# Patient Record
Sex: Female | Born: 1965
Health system: Southern US, Community
[De-identification: ages and names within clinical notes are randomized; demographics above are authoritative.]

## PROBLEM LIST (undated history)

## (undated) DIAGNOSIS — K208 Other esophagitis without bleeding: Secondary | ICD-10-CM

## (undated) DIAGNOSIS — E785 Hyperlipidemia, unspecified: Secondary | ICD-10-CM

## (undated) DIAGNOSIS — M1990 Unspecified osteoarthritis, unspecified site: Secondary | ICD-10-CM

## (undated) DIAGNOSIS — Z9889 Other specified postprocedural states: Secondary | ICD-10-CM

## (undated) DIAGNOSIS — T364X5A Adverse effect of tetracyclines, initial encounter: Secondary | ICD-10-CM

## (undated) DIAGNOSIS — K259 Gastric ulcer, unspecified as acute or chronic, without hemorrhage or perforation: Secondary | ICD-10-CM

## (undated) DIAGNOSIS — T50904A Poisoning by unspecified drugs, medicaments and biological substances, undetermined, initial encounter: Secondary | ICD-10-CM

## (undated) DIAGNOSIS — R112 Nausea with vomiting, unspecified: Secondary | ICD-10-CM

## (undated) HISTORY — DX: Hyperlipidemia, unspecified: E78.5

## (undated) HISTORY — PX: TONSILLECTOMY: SUR1361

## (undated) HISTORY — PX: COSMETIC SURGERY: SHX468

## (undated) HISTORY — PX: BREAST SURGERY: SHX581

## (undated) HISTORY — PX: HIP SURGERY: SHX245

---

## 2003-12-26 HISTORY — PX: BREAST SURGERY: SHX581

## 2011-01-15 ENCOUNTER — Encounter: Payer: Self-pay | Admitting: Unknown Physician Specialty

## 2011-07-10 ENCOUNTER — Other Ambulatory Visit (HOSPITAL_COMMUNITY): Payer: Self-pay | Admitting: Orthopedic Surgery

## 2011-07-10 DIAGNOSIS — M545 Low back pain, unspecified: Secondary | ICD-10-CM

## 2011-07-12 ENCOUNTER — Other Ambulatory Visit (HOSPITAL_COMMUNITY): Payer: Self-pay

## 2011-07-14 ENCOUNTER — Ambulatory Visit (HOSPITAL_COMMUNITY)
Admission: RE | Admit: 2011-07-14 | Discharge: 2011-07-14 | Disposition: A | Discharge status: Home / Self Care | Payer: BC Managed Care – PPO | Source: Ambulatory Visit | Attending: Orthopedic Surgery | Admitting: Orthopedic Surgery

## 2011-07-14 DIAGNOSIS — M25559 Pain in unspecified hip: Secondary | ICD-10-CM | POA: Insufficient documentation

## 2011-07-14 DIAGNOSIS — M549 Dorsalgia, unspecified: Secondary | ICD-10-CM | POA: Insufficient documentation

## 2011-07-14 DIAGNOSIS — M545 Low back pain: Secondary | ICD-10-CM

## 2011-07-14 DIAGNOSIS — M4180 Other forms of scoliosis, site unspecified: Secondary | ICD-10-CM | POA: Insufficient documentation

## 2011-07-14 DIAGNOSIS — IMO0001 Reserved for inherently not codable concepts without codable children: Secondary | ICD-10-CM | POA: Insufficient documentation

## 2011-07-14 DIAGNOSIS — M79609 Pain in unspecified limb: Secondary | ICD-10-CM | POA: Insufficient documentation

## 2011-09-11 ENCOUNTER — Other Ambulatory Visit: Payer: Self-pay | Admitting: Unknown Physician Specialty

## 2011-09-11 DIAGNOSIS — N6320 Unspecified lump in the left breast, unspecified quadrant: Secondary | ICD-10-CM

## 2011-09-20 ENCOUNTER — Ambulatory Visit
Admission: RE | Admit: 2011-09-20 | Discharge: 2011-09-20 | Disposition: A | Discharge status: Home / Self Care | Payer: BC Managed Care – PPO | Source: Ambulatory Visit | Attending: Unknown Physician Specialty | Admitting: Unknown Physician Specialty

## 2011-09-20 DIAGNOSIS — N6320 Unspecified lump in the left breast, unspecified quadrant: Secondary | ICD-10-CM

## 2012-03-22 ENCOUNTER — Telehealth: Payer: Self-pay | Admitting: *Deleted

## 2012-03-22 NOTE — Telephone Encounter (Signed)
Patient returned my call.  Confirmed 04/01/12 genetics appt w/ pt.

## 2012-04-01 ENCOUNTER — Other Ambulatory Visit: Payer: BC Managed Care – PPO | Admitting: Lab

## 2012-04-01 ENCOUNTER — Ambulatory Visit (HOSPITAL_BASED_OUTPATIENT_CLINIC_OR_DEPARTMENT_OTHER): Payer: BC Managed Care – PPO | Admitting: Genetic Counselor

## 2012-04-01 DIAGNOSIS — Z803 Family history of malignant neoplasm of breast: Secondary | ICD-10-CM

## 2012-04-01 NOTE — Progress Notes (Signed)
Tammie Moore, a 46 y.o. female, came in for a discussion of her family history of breast cancer. She presents to clinic today to discuss the possibility of a genetic predisposition to cancer, and to further clarify her risks, as well as her family members' risks for cancer.   HISTORY OF PRESENT ILLNESS: Tammie Moore is a 46 y.o. female with no personal history of cancer.    No past medical history on file.  No past surgical history on file.  History  Substance Use Topics  . Smoking status: Not on file  . Smokeless tobacco: Not on file  . Alcohol Use: Not on file    REPRODUCTIVE HISTORY AND PERSONAL RISK ASSESSMENT FACTORS: Menarche was at age 4.   Pre-menopausal Uterus Intact: Yes Ovaries Intact: Yes G2P2A0 , first live birth at age 41  She has not previously undergone treatment for infertility.   OCP use:  Approximately 5-10 years   She has not used HRT in the past.    FAMILY HISTORY:  We obtained a detailed, 4-generation family history.  Significant diagnoses are listed below: The patient has one sister who tested negative for a BRCA mutation.  Her mother was diagnosed with breast cancer at age 65.  The patient's mother had four sisters and three brothers.  One sister had breast cancer at an unknown age, and died of uterine cancer at an unknown age; a second sister was diagnosed with breast cancer in her 88s; a third sister was diagnosed with breast cancer in her 68s and then again in her 9s, and the fourth sister is alive and well.  The brothers did not have cancer.  The patient has one cousin who was diagnosed with breast cancer in her 12s-50s, and another cousin diagnosed with melanoma. Both were daughters of women with breast cancer.  There is no other cancer history on this side of the family.  The patient's father is alive and well at age 36.  He had 12 brothers and sisters.  One sister was diagnosed with breast cancer in her 39s and ovarian cancer in her 29s.   There is no other reported cancer history on this side of the family.  Patient's maternal ancestors are of [place] descent, and paternal ancestors are of [place] descent. There is no reported Ashkenazi Jewish ancestry. There is no known consanguinity.  GENETIC COUNSELING RISK ASSESSMENT, DISCUSSION, AND SUGGESTED FOLLOW UP: We reviewed the natural history and genetic etiology of sporadic, familial and hereditary cancer syndromes.  Approximately 5-10% of breast cancer is hereditary, and 85% is a result of a BRCA1 or 2 mutation.  We reviewed the red flags of hereditary breast cancer, including the dominant inheritance pattern, and what the patient's sister's negative BRCA test means for the patient as well as her sister.  We also reviewed other tests, specifically the BreastNext panel by Karna Dupes, as a way to look further into hereditary breast cancer risks should a BRCA test come back negative.  We discussed the the best person to test is someone who has had cancer, such as the patient's mother.  By testing this individual it allows [place] to provide a more accurate risk assessment for the patient and her family members.  The patient understands and will talk with her mother about undergoing testing.    The patient's family history is suggestive of the following possible diagnosis: hereditary breast cancer  We discussed that identification of a hereditary cancer syndrome may help her care providers tailor the patients  medical management. If a mutation indicating a hereditary breast cancer is detected in this case, the [employer] recommendations would include increased cancer screening and possible prophylactic sugery. If a mutation is detected, the patient will be referred back to the referring provider and to any additional appropriate care providers to discuss the relevant options.   If a mutation is not found in the patient, this will decrease the likelihood of having breast  cancer as a result of a hereditary breast cancer syndrome.  However it does not reduce her risk for breast cancer, based on her family history.  Cancer surveillance options would be discussed for the patient according to the appropriate standard National Comprehensive Cancer Network and American Cancer Society guidelines, with consideration of their personal and family history risk factors. In this case, the patient will be referred back to their care providers for discussions of management.   In order to estimate her chance of having a BRCA1 or BRCA2 mutation, we used statistical models (Penn II) and laboratory data that take into account her personal medical history, family history and ancestry.  Because each model is different, there can be a lot of variability in the risks they give.  Therefore, these numbers must be considered a rough range and not a precise risk of having a BRCA1 Or BRCA2 mutation.  These models estimate that she has approximately a 2% chance of having a mutation (1% for BRCA1 and 1% for BRCA2), based on her paternal family history. These models estimate that she has approximately a 7% chance of having a mutation (3% for BRCA1 and 4% for BRCA2), based on her maternal family history.  Based on this assessment of her family and personal history, genetic testing is recommended.  Based on the patient's personal and family history, statistical models Dondra Spry)  and literature data were used to estimate her risk of developing breast cancer. These estimate her lifetime risk of developing breast cancer to be approximately 18.3%. This estimation does not take into account any genetic testing results.   After considering the risks, benefits, and limitations, the patient decided to talk with her mother about undergoing genetic testing.   If the patient's mother does not want to undergo testing, we will then pursue genetic testing on the patient.  The patient was seen for a total of 55 minutes,  greater than 50% of which was spent face-to-face counseling.  This plan is being carried out per Dr. Milta Deiters recommendations.  This note will also be sent to the referring provider via the electronic medical record. The patient will be supplied with a summary of this genetic counseling discussion as well as educational information on the discussed hereditary cancer syndromes following the conclusion of their visit.   Patient was discussed with Dr. Drue Second.  EDUCATIONAL INFORMATION SUPPLIED TO PATIENT AT ENCOUNTER:  Hereditary Breast and Ovarian Cancer Syndrome brochure  _______________________________________________________________________ For Office Staff:  Number of people involved in session: 3 Was an Intern/ student involved with case: not applicable

## 2012-04-02 ENCOUNTER — Telehealth: Payer: Self-pay | Admitting: Genetic Counselor

## 2012-04-02 ENCOUNTER — Other Ambulatory Visit: Payer: Self-pay | Admitting: Unknown Physician Specialty

## 2012-04-02 DIAGNOSIS — D249 Benign neoplasm of unspecified breast: Secondary | ICD-10-CM

## 2012-04-02 NOTE — Telephone Encounter (Signed)
Ms. Aufiero called and updated her FH.  Her maternal aunts were paternal half sisters to her mother.  Their mother died in her 65s reportedly from heart related complications.  We discussed that, without knowing their mother's FH, her grandfather is the common link to her mother and aunts.  If there was a FH of breast cancer on their mother's side of the family, then the chance that the patient's mother had breast cancer from a hereditary factor would be less.  However, the patient is unaware of her grandfather's FH so we are not sure that it is not coming from that side as well.  I agreed that the patient's paternal aunt with both breast and ovarian cancer should consider testing.  We discussed that there are a total of 12 children on that side of the family, with lots of nieces and nephews - none of whom had cancer.  Based on risk model's, there is a 2% for testing positive based on her paternal FH.  We discussed that we can test the patient, but the most informative person to test is someone with cancer.  The patient understands this, and wants to call her paternal aunt before pursuing testing at this time.  We agreed that if her aunt does not want to get tested, we will revisit the issue of the patient coming in for a blood draw.

## 2012-04-12 ENCOUNTER — Encounter: Payer: Self-pay | Admitting: Genetic Counselor

## 2012-04-15 ENCOUNTER — Other Ambulatory Visit: Payer: BC Managed Care – PPO

## 2012-08-21 ENCOUNTER — Other Ambulatory Visit: Payer: Self-pay | Admitting: Unknown Physician Specialty

## 2012-08-21 DIAGNOSIS — D249 Benign neoplasm of unspecified breast: Secondary | ICD-10-CM

## 2013-03-20 ENCOUNTER — Ambulatory Visit
Admission: RE | Admit: 2013-03-20 | Discharge: 2013-03-20 | Disposition: A | Discharge status: Home / Self Care | Payer: BC Managed Care – PPO | Source: Ambulatory Visit | Attending: Unknown Physician Specialty | Admitting: Unknown Physician Specialty

## 2013-03-20 DIAGNOSIS — D249 Benign neoplasm of unspecified breast: Secondary | ICD-10-CM

## 2014-04-22 ENCOUNTER — Other Ambulatory Visit: Payer: Self-pay

## 2014-04-22 DIAGNOSIS — Z1231 Encounter for screening mammogram for malignant neoplasm of breast: Secondary | ICD-10-CM

## 2014-05-05 ENCOUNTER — Ambulatory Visit: Payer: BC Managed Care – PPO

## 2014-05-12 ENCOUNTER — Ambulatory Visit
Admission: RE | Admit: 2014-05-12 | Discharge: 2014-05-12 | Disposition: A | Discharge status: Home / Self Care | Payer: BC Managed Care – PPO | Source: Ambulatory Visit

## 2014-05-12 ENCOUNTER — Encounter (INDEPENDENT_AMBULATORY_CARE_PROVIDER_SITE_OTHER): Payer: Self-pay

## 2014-05-12 DIAGNOSIS — Z1231 Encounter for screening mammogram for malignant neoplasm of breast: Secondary | ICD-10-CM

## 2015-05-26 DIAGNOSIS — K259 Gastric ulcer, unspecified as acute or chronic, without hemorrhage or perforation: Secondary | ICD-10-CM

## 2015-05-26 HISTORY — DX: Gastric ulcer, unspecified as acute or chronic, without hemorrhage or perforation: K25.9

## 2015-06-21 ENCOUNTER — Encounter: Payer: Self-pay | Admitting: Internal Medicine

## 2015-06-21 ENCOUNTER — Other Ambulatory Visit: Payer: Self-pay | Admitting: Internal Medicine

## 2015-06-21 MED ORDER — PANTOPRAZOLE SODIUM 40 MG PO TBEC: 40.0000 mg | DELAYED_RELEASE_TABLET | Freq: Two times a day (BID) | ORAL | Status: DC

## 2015-06-21 NOTE — Progress Notes (Signed)
Patient ID: Tammie Moore, female   DOB: 1966/10/21, 49 y.o.   MRN: [id number] HPI: Tammie Moore is a 49 year old female with a past medical history consistent for right hip pain on chronic NSAIDs now with epigastric abdominal pain 4 weeks. Pain is in the epigastrium associated with nausea. One episode of vomiting in the last week, nonbloody nonbilious. Pain is relieved slightly by eating and then 30 minutes to an hour after eating becomes worse again. Pain is fairly constant and gnawing. No change in bowel habit, melena or hematochezia. She is using ibuprofen 6 date 100 mg 3-4 times daily approximately 5 days per week for chronic hip pain. No early satiety. No change in appetite. No weight loss.  Meds: Ibuprofen as per history of present illness  Allergies not on file  No family history on file.  History  Substance Use Topics  . Smoking status: Not on file  . Smokeless tobacco: Not on file  . Alcohol Use: Not on file    ROS: As per history of present illness, otherwise negative  There were no vitals taken for this visit. Constitutional: Well-developed and well-nourished. No distress. HEENT: Normocephalic and atraumatic. Marland Kitchen Conjunctivae are normal.  No scleral icterus. Abdominal: Soft, epigastric tenderness without rebound or guarding, nondistended. Bowel sounds active throughout. There are no masses palpable. No hepatosplenomegaly. Extremities: no clubbing, cyanosis, or edema Psychiatric: Normal mood and affect. Behavior is normal.   ASSESSMENT/PLAN: 49 year old female with a past medical history consistent for right hip pain on chronic NSAIDs now with epigastric abdominal pain 4 weeks.   1. Epigastric pain -- NSAID-related gastroduodenitis versus ulcer disease. Pantoprazole 40 mg twice a day before meals recommended 1 month, if symptoms improve can decrease to once daily while using chronic NSAIDs. Avoid NSAIDs as much as possible. Hip surgery planned for later in July which  hopefully will relieve pain and allow her to stop NSAIDs. If no improvement, worsening of pain, or any evidence of bleeding, upper endoscopy recommended.

## 2015-06-22 ENCOUNTER — Telehealth: Payer: Self-pay

## 2015-06-22 NOTE — Telephone Encounter (Signed)
Prior authorization for pantoprazole sent through cover my meds.

## 2015-06-23 ENCOUNTER — Other Ambulatory Visit: Payer: Self-pay | Admitting: Internal Medicine

## 2015-06-23 MED ORDER — SUCRALFATE 1 G PO TABS: 1.0000 g | ORAL_TABLET | Freq: Three times a day (TID) | ORAL | Status: DC

## 2015-06-24 NOTE — Telephone Encounter (Signed)
Patient has been approved through [facility] for pantoprazole and is effective from 06/22/15-06/21/16. Reference # [id number]

## 2015-08-10 ENCOUNTER — Telehealth: Payer: Self-pay | Admitting: Internal Medicine

## 2015-08-10 MED ORDER — LIDOCAINE VISCOUS 2 % MT SOLN: 20.0000 mL | Freq: Four times a day (QID) | OROMUCOSAL | Status: DC | PRN

## 2015-08-10 MED ORDER — SUCRALFATE 1 GM/10ML PO SUSP: 1.0000 g | Freq: Three times a day (TID) | ORAL | Status: DC

## 2015-08-10 NOTE — Telephone Encounter (Signed)
Pt called to report she developed acute odynophagia and chest discomfort several hours after taking doxycycline Discomfort has persisted with swallowing liquids and solids. No dysphagia. Able to handle her secretions No nausea or vomiting or fever Likely pill-induced esophagitis Continue pantoprazole 40 mg daily Begin liquid Carafate 1 g before meals and at bedtime Topical swallowed lidocaine as needed for pain Call if not improving or if worsening

## 2015-12-26 HISTORY — PX: BREAST BIOPSY: SHX20

## 2016-01-31 MED FILL — PANTOPRAZOLE SOD DR 40 MG T: 40 | 30 days supply | Qty: 60 | Fill #3

## 2016-02-01 MED FILL — FLUCONAZOLE 150 MG TABLET: 150 | 1 days supply | Qty: 1 | Fill #0

## 2016-05-31 MED FILL — FLUCONAZOLE 150 MG TABLET: 150 | 1 days supply | Qty: 1 | Fill #0

## 2016-06-02 ENCOUNTER — Other Ambulatory Visit: Payer: Self-pay | Admitting: Unknown Physician Specialty

## 2016-06-02 DIAGNOSIS — Z1231 Encounter for screening mammogram for malignant neoplasm of breast: Secondary | ICD-10-CM

## 2016-06-15 ENCOUNTER — Ambulatory Visit
Admission: RE | Admit: 2016-06-15 | Discharge: 2016-06-15 | Disposition: A | Discharge status: Home / Self Care | Payer: BLUE CROSS/BLUE SHIELD | Source: Ambulatory Visit | Attending: Unknown Physician Specialty | Admitting: Unknown Physician Specialty

## 2016-06-15 DIAGNOSIS — Z1231 Encounter for screening mammogram for malignant neoplasm of breast: Secondary | ICD-10-CM

## 2016-10-05 ENCOUNTER — Ambulatory Visit: Payer: Self-pay | Admitting: Orthopedic Surgery

## 2016-12-05 NOTE — Patient Instructions (Addendum)
Tammie Moore  12/05/2016   Your procedure is scheduled on: 12/13/16  Report to [facility] Main  Entrance take Tammie Moore  elevators to 3rd floor to  [place] [facility] [place]  AM.  Call this number if you have problems the morning of surgery [phone]   Remember: ONLY 1 PERSON MAY GO WITH YOU TO SHORT STAY TO GET  READY MORNING OF Tammie Moore.  Do not eat food or drink liquids :After Midnight.     Take these medicines the morning of surgery with A SIP OF WATER: Pantoprazole May take Valium if needed                                You may not have any metal on your body including hair pins and              piercings  Do not wear jewelry, make-up, lotions, powders or perfumes, deodorant             Do not wear nail polish.  Do not shave  48 hours prior to surgery.              Men may shave face and neck.   Do not bring valuables to the hospital. [place].  Contacts, dentures or bridgework may not be worn into surgery.  Leave suitcase in the car. After surgery it may be brought to your room.                Please read over the following fact sheets you were given: _____________________________________________________________________             [facility] - Preparing for Surgery Before surgery, you can play an important role.  Because skin is not sterile, your skin needs to be as free of germs as possible.  You can reduce the number of germs on your skin by washing with CHG (chlorahexidine gluconate) soap before surgery.  CHG is an antiseptic cleaner which kills germs and bonds with the skin to continue killing germs even after washing. Please DO NOT use if you have an allergy to CHG or antibacterial soaps.  If your skin becomes reddened/irritated stop using the CHG and inform your nurse when you arrive at Short Stay. Do not shave (including legs and underarms) for at least 48 hours prior to the first  CHG shower.  You may shave your face/neck. Please follow these instructions carefully:  1.  Shower with CHG Soap the night before surgery and the  morning of Surgery.  2.  If you choose to wash your hair, wash your hair first as usual with your  normal  shampoo.  3.  After you shampoo, rinse your hair and body thoroughly to remove the  shampoo.                           4.  Use CHG as you would any other liquid soap.  You can apply chg directly  to the skin and wash                       Gently with a scrungie or clean washcloth.  5.  Apply the CHG Soap to your body ONLY FROM THE NECK DOWN.   Do not use on face/ open                           Wound or open sores. Avoid contact with eyes, ears mouth and genitals (private parts).                       Wash face,  Genitals (private parts) with your normal soap.             6.  Wash thoroughly, paying special attention to the area where your surgery  will be performed.  7.  Thoroughly rinse your body with warm water from the neck down.  8.  DO NOT shower/wash with your normal soap after using and rinsing off  the CHG Soap.                9.  Pat yourself dry with a clean towel.            10.  Wear clean pajamas.            11.  Place clean sheets on your bed the night of your first shower and do not  sleep with pets. Day of Surgery : Do not apply any lotions/deodorants the morning of surgery.  Please wear clean clothes to the hospital/surgery center.  FAILURE TO FOLLOW THESE INSTRUCTIONS MAY RESULT IN THE CANCELLATION OF YOUR SURGERY PATIENT SIGNATURE_________________________________  NURSE SIGNATURE__________________________________  ________________________________________________________________________   Adam Phenix  An incentive spirometer is a tool that can help keep your lungs clear and active. This tool measures how well you are filling your lungs with each breath. Taking long deep breaths may help reverse or decrease the  chance of developing breathing (pulmonary) problems (especially infection) following:  A long period of time when you are unable to move or be active. BEFORE THE PROCEDURE   If the spirometer includes an indicator to show your best effort, your nurse or respiratory therapist will set it to a desired goal.  If possible, sit up straight or lean slightly forward. Try not to slouch.  Hold the incentive spirometer in an upright position. INSTRUCTIONS FOR USE  1. Sit on the edge of your bed if possible, or sit up as far as you can in bed or on a chair. 2. Hold the incentive spirometer in an upright position. 3. Breathe out normally. 4. Place the mouthpiece in your mouth and seal your lips tightly around it. 5. Breathe in slowly and as deeply as possible, raising the piston or the ball toward the top of the column. 6. Hold your breath for 3-5 seconds or for as long as possible. Allow the piston or ball to fall to the bottom of the column. 7. Remove the mouthpiece from your mouth and breathe out normally. 8. Rest for a few seconds and repeat Steps 1 through 7 at least 10 times every 1-2 hours when you are awake. Take your time and take a few normal breaths between deep breaths. 9. The spirometer may include an indicator to show your best effort. Use the indicator as a goal to work toward during each repetition. 10. After each set of 10 deep breaths, practice coughing to be sure your lungs are clear. If you have an incision (the cut made at the time of surgery), support your incision when coughing by placing a pillow or  rolled up towels firmly against it. Once you are able to get out of bed, walk around indoors and cough well. You may stop using the incentive spirometer when instructed by your caregiver.  RISKS AND COMPLICATIONS  Take your time so you do not get dizzy or light-headed.  If you are in pain, you may need to take or ask for pain medication before doing incentive spirometry. It is harder  to take a deep breath if you are having pain. AFTER USE  Rest and breathe slowly and easily.  It can be helpful to keep track of a log of your progress. Your caregiver can provide you with a simple table to help with this. If you are using the spirometer at home, follow these instructions: [place] IF:   You are having difficultly using the spirometer.  You have trouble using the spirometer as often as instructed.  Your pain medication is not giving enough relief while using the spirometer.  You develop fever of 100.5 F (38.1 C) or higher. SEEK IMMEDIATE MEDICAL CARE IF:   You cough up bloody sputum that had not been present before.  You develop fever of 102 F (38.9 C) or greater.  You develop worsening pain at or near the incision site. MAKE SURE YOU:   Understand these instructions.  Will watch your condition.  Will get help right away if you are not doing well or get worse. Document Released: 04/23/2007 Document Revised: 03/04/2012 Document Reviewed: 06/24/2007 ExitCare Patient Information 2014 Exit[employer], [place].   ________________________________________________________________________  WHAT IS A BLOOD TRANSFUSION? Blood Transfusion Information  A transfusion is the replacement of blood or some of its parts. Blood is made up of multiple cells which provide different functions.  Red blood cells carry oxygen and are used for blood loss replacement.  White blood cells fight against infection.  Platelets control bleeding.  Plasma helps clot blood.  Other blood products are available for specialized needs, such as hemophilia or other clotting disorders. BEFORE THE TRANSFUSION  Who gives blood for transfusions?   Healthy volunteers who are fully evaluated to make sure their blood is safe. This is blood bank blood. Transfusion therapy is the safest it has ever been in the practice of medicine. Before blood is taken from a donor, a complete history is taken  to make sure that person has no history of diseases nor engages in risky social behavior (examples are intravenous drug use or sexual activity with multiple partners). The donor's travel history is screened to minimize risk of transmitting infections, such as malaria. The donated blood is tested for signs of infectious diseases, such as HIV and hepatitis. The blood is then tested to be sure it is compatible with you in order to minimize the chance of a transfusion reaction. If you or a relative donates blood, this is often done in anticipation of surgery and is not appropriate for emergency situations. It takes many days to process the donated blood. RISKS AND COMPLICATIONS Although transfusion therapy is very safe and saves many lives, the main dangers of transfusion include:   Getting an infectious disease.  Developing a transfusion reaction. This is an allergic reaction to something in the blood you were given. Every precaution is taken to prevent this. The decision to have a blood transfusion has been considered carefully by your caregiver before blood is given. Blood is not given unless the benefits outweigh the risks. AFTER THE TRANSFUSION  Right after receiving a blood transfusion, you will usually  feel much better and more energetic. This is especially true if your red blood cells have gotten low (anemic). The transfusion raises the level of the red blood cells which carry oxygen, and this usually causes an energy increase.  The nurse administering the transfusion will monitor you carefully for complications. HOME CARE INSTRUCTIONS  No special instructions are needed after a transfusion. You may find your energy is better. Speak with your caregiver about any limitations on activity for underlying diseases you may have. SEEK MEDICAL CARE IF:   Your condition is not improving after your transfusion.  You develop redness or irritation at the intravenous (IV) site. SEEK IMMEDIATE MEDICAL CARE  IF:  Any of the following symptoms occur over the next 12 hours:  Shaking chills.  You have a temperature by mouth above 102 F (38.9 C), not controlled by medicine.  Chest, back, or muscle pain.  People around you feel you are not acting correctly or are confused.  Shortness of breath or difficulty breathing.  Dizziness and fainting.  You get a rash or develop hives.  You have a decrease in urine output.  Your urine turns a dark color or changes to pink, red, or brown. Any of the following symptoms occur over the next 10 days:  You have a temperature by mouth above 102 F (38.9 C), not controlled by medicine.  Shortness of breath.  Weakness after normal activity.  The white part of the eye turns yellow (jaundice).  You have a decrease in the amount of urine or are urinating less often.  Your urine turns a dark color or changes to pink, red, or brown. Document Released: 12/08/2000 Document Revised: 03/04/2012 Document Reviewed: 07/27/2008 [facility] Patient Information 2014 [place], [place].  _______________________________________________________________________

## 2016-12-06 ENCOUNTER — Encounter (HOSPITAL_COMMUNITY): Payer: Self-pay

## 2016-12-06 ENCOUNTER — Encounter (INDEPENDENT_AMBULATORY_CARE_PROVIDER_SITE_OTHER): Payer: Self-pay

## 2016-12-06 ENCOUNTER — Encounter (HOSPITAL_COMMUNITY)
Admission: RE | Admit: 2016-12-06 | Discharge: 2016-12-06 | Disposition: A | Discharge status: Home / Self Care | Payer: BLUE CROSS/BLUE SHIELD | Source: Ambulatory Visit | Attending: Orthopedic Surgery | Admitting: Orthopedic Surgery

## 2016-12-06 DIAGNOSIS — Z01812 Encounter for preprocedural laboratory examination: Secondary | ICD-10-CM | POA: Diagnosis not present

## 2016-12-06 DIAGNOSIS — M1611 Unilateral primary osteoarthritis, right hip: Secondary | ICD-10-CM | POA: Insufficient documentation

## 2016-12-06 HISTORY — DX: Unspecified osteoarthritis, unspecified site: M19.90

## 2016-12-06 HISTORY — DX: Other esophagitis: K20.8

## 2016-12-06 HISTORY — DX: Other specified postprocedural states: R11.2

## 2016-12-06 HISTORY — DX: Poisoning by unspecified drugs, medicaments and biological substances, undetermined, initial encounter: T50.904A

## 2016-12-06 HISTORY — DX: Adverse effect of tetracyclines, initial encounter: T36.4X5A

## 2016-12-06 HISTORY — DX: Other specified postprocedural states: Z98.890

## 2016-12-06 HISTORY — DX: Gastric ulcer, unspecified as acute or chronic, without hemorrhage or perforation: K25.9

## 2016-12-06 HISTORY — DX: Nausea with vomiting, unspecified: Z98.890

## 2016-12-06 HISTORY — DX: Other esophagitis without bleeding: K20.80

## 2016-12-06 LAB — PROTIME-INR
INR: 0.99
PROTHROMBIN TIME: 13.1 s (ref 11.4–15.2)

## 2016-12-06 LAB — CBC
HEMATOCRIT: 42.8 % (ref 36.0–46.0)
HEMOGLOBIN: 14.6 g/dL (ref 12.0–15.0)
MCH: 29.4 pg (ref 26.0–34.0)
MCHC: 34.1 g/dL (ref 30.0–36.0)
MCV: 86.3 fL (ref 78.0–100.0)
PLATELETS: 313 10*3/uL (ref 150–400)
RBC: 4.96 MIL/uL (ref 3.87–5.11)
RDW: 12.9 % (ref 11.5–15.5)
WBC: 9.1 10*3/uL (ref 4.0–10.5)

## 2016-12-06 LAB — URINALYSIS, ROUTINE W REFLEX MICROSCOPIC
Bilirubin Urine: NEGATIVE
Glucose, UA: NEGATIVE mg/dL
Hgb urine dipstick: NEGATIVE
Ketones, ur: NEGATIVE mg/dL
LEUKOCYTES UA: NEGATIVE
NITRITE: NEGATIVE
PH: 6 (ref 5.0–8.0)
PROTEIN: NEGATIVE mg/dL
Specific Gravity, Urine: 1.003 — ABNORMAL LOW (ref 1.005–1.030)

## 2016-12-06 LAB — SURGICAL PCR SCREEN
MRSA, PCR: NEGATIVE
STAPHYLOCOCCUS AUREUS: NEGATIVE

## 2016-12-06 LAB — APTT: aPTT: 32 seconds (ref 24–36)

## 2016-12-06 LAB — COMPREHENSIVE METABOLIC PANEL
ALK PHOS: 44 U/L (ref 38–126)
ALT: 23 U/L (ref 14–54)
AST: 26 U/L (ref 15–41)
Albumin: 4.4 g/dL (ref 3.5–5.0)
Anion gap: 7 (ref 5–15)
BILIRUBIN TOTAL: 0.8 mg/dL (ref 0.3–1.2)
BUN: 12 mg/dL (ref 6–20)
CALCIUM: 10.2 mg/dL (ref 8.9–10.3)
CHLORIDE: 104 mmol/L (ref 101–111)
CO2: 25 mmol/L (ref 22–32)
CREATININE: 0.9 mg/dL (ref 0.44–1.00)
GFR calc Af Amer: >60 mL/min (ref >60)
Glucose, Bld: 86 mg/dL (ref 65–99)
Potassium: 4.3 mmol/L (ref 3.5–5.1)
Sodium: 136 mmol/L (ref 135–145)
Total Protein: 7.7 g/dL (ref 6.5–8.1)

## 2016-12-06 LAB — ABO/RH: ABO/RH(D): O POS

## 2016-12-06 LAB — HCG, SERUM, QUALITATIVE: PREG SERUM: NEGATIVE

## 2016-12-12 ENCOUNTER — Ambulatory Visit: Payer: Self-pay | Admitting: Orthopedic Surgery

## 2016-12-12 NOTE — H&P (Signed)
Tammie Moore DOB: November 02, 1966 Married / Language: English / Race: White Female Date of Admission:  12/13/2016 CC:  Right Hip Pain History of Present Illness The patient is a 50 year old female who comes in for a preoperative History and Physical. The patient is scheduled for a right total hip arthroplasty (anterior) to be performed by Dr. Dione Plover. Aluisio, MD at [facility] on 12-13-2016. The patient is a 50 year old female who presented with a hip problem. The patient reports right hip problems including pain, catching and popping symptoms that have been present for many month(s). Symptoms reported include hip pain The patient reports symptoms radiating to the: right groin (around to right buttock. radiates down to right ankle). The patient describes the hip problem as sharp, dull, aching, throbbing, grinding, stabbing, shooting and burning.The symptoms are described as severe.The patient feels as if their symptoms are does feel they are worsening. Symptoms are exacerbated by movement, flexing hip, squatting, weight bearing, running, walking, sitting and lying on the affected side. Current treatment includes nonsteroidal anti-inflammatory drugs (Motrin and Valium). Previous workup for this problem has included hip MRI. First diagnosed with a right hip labral tear; referred to [facility]. 2015 labral tear sx Dr. Aretha Parrot. 12 weeks post op pt. re-tore labarum. July 2016 second labral tendon graft sx. Dec. 2016 started having pain again. MRI Jan. 2017. She had an intra-articular cortisone injection in the right hip in May with no relief. She has trouble sleeping at night because of the pain. The pain is occurring in her hips at all times. She has had two arthroscopies and labral repairs with the second one being a labral autograft grafting of tendon and placing it in the area of the labrum. She states that she has pain at all times in that right hip. It is definitely limiting what she can and cannot  do. Unfortunately, this is all getting progressively worse with time. After her first scope with Dr. Aretha Parrot, she states that she was given the option of having the repeat scope and labral repair versus a hip replacement. She opted for the second arthroscopy and unfortunately has not had significant improvement. She did have an intra-articular hip injection in May and she said for a few weeks she had considerable improvements and was able to do a lot more, but then it wore off real quickly. She did not have full resolution of her pain, but it got better to the point where she was able to do more and she was satisfied with how she felt during that time span. She is currently not having any left hip pain. Unfortunately, she does have a significant problem with the hip. I did review her scope, pictures and she had a considerable amount of acetabular degeneration in that second scope. It appears that the arthroscopic pictures showed a more advanced situation than what the plain films show. Unfortunately, she is not getting better at all. The injection did help some which suggest that the process is intra-articular in nature. At this point, she really feels like she needs to do something. I would not recommend another arthroscopy. I would recommend that she pursue total hip arthroplasty because she is definitely heading quickly in that direction. We did discuss that in detail today procedure risks, potential complications, rehab course and she would like to go ahead and proceed. They have been treated conservatively in the past for the above stated problem and despite conservative measures, they continue to have progressive pain  and severe functional limitations and dysfunction. They have failed non-operative management including home exercise, medications, and injections. It is felt that they would benefit from undergoing total joint replacement. Risks and benefits of the procedure have been discussed with the patient  and they elect to proceed with surgery. There are no active contraindications to surgery such as ongoing infection or rapidly progressive neurological disease.   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  Family History  Cancer  Mother. Congestive Heart Failure  Mother. Diabetes Mellitus  Mother. First Degree Relatives  reported Heart Disease  Father. Heart disease in female family member before age 61   Social History Children  4 Current drinker  11/25/2013: Currently drinks beer and wine only occasionally per week Current work status  working part time Exercise  Exercises weekly; does running / walking Living situation  live with spouse Marital status  married No history of drug/alcohol rehab  Not under pain contract  Number of flights of stairs before winded  greater than 5 Tobacco use  Never smoker. 11/25/2013 Advance Directives  Living Will, Healthcare POA  Medication History Motrin IB (Oral) Specific strength unknown - Active. Mirena (20MCG/24HR IUD, Intrauterine) Active. Valium (10MG  Tablet, Oral) Active. Protonix (40MG  Tablet DR, Oral as needed) Active.   Past Surgical History Breast Reconstruction  bilateral Tonsillectomy    Review of Systems General Not Present- Chills, Fatigue, Fever, Memory Loss, Night Sweats, Weight Gain and Weight Loss. Skin Not Present- Eczema, Hives, Itching, Lesions and Rash. HEENT Not Present- Dentures, Double Vision, Headache, Hearing Loss, Tinnitus and Visual Loss. Respiratory Not Present- Allergies, Chronic Cough, Coughing up blood, Shortness of breath at rest and Shortness of breath with exertion. Cardiovascular Not Present- Chest Pain, Difficulty Breathing Lying Down, Murmur, Palpitations, Racing/skipping  heartbeats and Swelling. Gastrointestinal Not Present- Abdominal Pain, Bloody Stool, Constipation, Diarrhea, Difficulty Swallowing, Heartburn, Jaundice, Loss of appetitie, Nausea and Vomiting. Female Genitourinary Not Present- Blood in Urine, Discharge, Flank Pain, Incontinence, Painful Urination, Urgency, Urinary frequency, Urinary Retention, Urinating at Night and Weak urinary stream. Musculoskeletal Present- Back Pain, Joint Pain, Morning Stiffness, Muscle Pain and Spasms. Not Present- Joint Swelling and Muscle Weakness. Neurological Not Present- Blackout spells, Difficulty with balance, Dizziness, Paralysis, Tremor and Weakness. Psychiatric Not Present- Insomnia.  Vitals Weight: 148 lb Height: 65in Body Surface Area: 1.74 m Body Mass Index: 24.63 kg/m  Pulse: 64 (Regular)  BP: 118/58 (Sitting, Right Arm, Standard)   Physical Exam  General Mental Status -Alert, cooperative and good historian. General Appearance-pleasant, Not in acute distress. Orientation-Oriented X3. Build & Nutrition-Well nourished and Well developed.  Head and Neck Head-normocephalic, atraumatic . Neck Global Assessment - supple, no bruit auscultated on the right, no bruit auscultated on the left.  Eye Vision-Wears contact lenses. Pupil - Bilateral-Regular and Round. Motion - Bilateral-EOMI.  Chest and Lung Exam Auscultation Breath sounds - clear at anterior chest wall and clear at posterior chest wall. Adventitious sounds - No Adventitious sounds.  Cardiovascular Auscultation Rhythm - Regular rate and rhythm. Heart Sounds - S1 WNL and S2 WNL. Murmurs & Other Heart Sounds - Auscultation of the heart reveals - No Murmurs.  Abdomen Palpation/Percussion Tenderness - Abdomen is non-tender to palpation. Rigidity (guarding) - Abdomen is soft. Auscultation Auscultation of the abdomen reveals - Bowel sounds normal.  Female Genitourinary Note: Not done, not pertinent  to present  illness   Musculoskeletal Note: Very pleasant, well-developed female, alert and oriented, in no apparent distress. Evaluation of her left hip shows normal range of motion without any discomfort. Right hip can be flexed to about 130, rotated in 30, out 40, abducted 40 with pain on flexion and rotation of the hip both internal and external rotation. She does not have any trochanteric tenderness. She does have an antalgic gait pattern on the right. She had a paper copy of an x-ray taken in January of this year did show some superior joint space narrowing and significant sclerosis of the superior acetabulum. X-rays taken today shows about the same appearance of the joint with some narrowing superiorly and with the sclerosis in the sourcil area of the acetabulum.  Assessment & Plan Primary osteoarthritis of right hip (M16.11)  Note:Surgical Plans: Right Total Hip Replacement - Anterior Approach  Disposition: Home  PCP: Carley Hammed, FNP  IV TXA  Anesthesia Issues: Nausea and vomiting following hip scope.  Signed electronically by Ok Edwards, III PA-C

## 2016-12-12 NOTE — Anesthesia Preprocedure Evaluation (Signed)
Anesthesia Evaluation  Patient identified by MRN, date of birth, ID band Patient awake    Reviewed: Allergy & Precautions, NPO status , Patient's Chart, lab work & pertinent test results  History of Anesthesia Complications (+) PONV and history of anesthetic complications  Airway Mallampati: II  TM Distance: >3 FB Neck ROM: Full    Dental no notable dental hx.    Pulmonary neg pulmonary ROS,    Pulmonary exam normal breath sounds clear to auscultation       Cardiovascular negative cardio ROS Normal cardiovascular exam Rhythm:Regular Rate:Normal     Neuro/Psych negative neurological ROS  negative psych ROS   GI/Hepatic Neg liver ROS, PUD,   Endo/Other  negative endocrine ROS  Renal/GU negative Renal ROS     Musculoskeletal  (+) Arthritis ,   Abdominal   Peds  Hematology negative hematology ROS (+)   Anesthesia Other Findings   Reproductive/Obstetrics negative OB ROS                             Anesthesia Physical Anesthesia Plan  ASA: I  Anesthesia Plan: Spinal   Post-op Pain Management:    Induction: Intravenous  Airway Management Planned:   Additional Equipment:   Intra-op Plan:   Post-operative Plan:   Informed Consent: I have reviewed the patients History and Physical, chart, labs and discussed the procedure including the risks, benefits and alternatives for the proposed anesthesia with the patient or authorized representative who has indicated his/her understanding and acceptance.   Dental advisory given  Plan Discussed with: CRNA  Anesthesia Plan Comments:         Anesthesia Quick Evaluation

## 2016-12-13 ENCOUNTER — Inpatient Hospital Stay (HOSPITAL_COMMUNITY): Payer: BLUE CROSS/BLUE SHIELD

## 2016-12-13 ENCOUNTER — Inpatient Hospital Stay (HOSPITAL_COMMUNITY)
Admission: RE | Admit: 2016-12-13 | Discharge: 2016-12-15 | DRG: 470 | Disposition: A | Discharge status: Home Health | Payer: BLUE CROSS/BLUE SHIELD | Source: Ambulatory Visit | Attending: Orthopedic Surgery | Admitting: Orthopedic Surgery

## 2016-12-13 ENCOUNTER — Inpatient Hospital Stay (HOSPITAL_COMMUNITY): Payer: BLUE CROSS/BLUE SHIELD | Admitting: Anesthesiology

## 2016-12-13 ENCOUNTER — Encounter (HOSPITAL_COMMUNITY): Payer: Self-pay | Admitting: Registered Nurse

## 2016-12-13 ENCOUNTER — Encounter (HOSPITAL_COMMUNITY)
Admission: RE | Disposition: A | Discharge status: Home Health | Payer: Self-pay | Source: Ambulatory Visit | Attending: Orthopedic Surgery

## 2016-12-13 DIAGNOSIS — Z8711 Personal history of peptic ulcer disease: Secondary | ICD-10-CM | POA: Diagnosis not present

## 2016-12-13 DIAGNOSIS — M1611 Unilateral primary osteoarthritis, right hip: Principal | ICD-10-CM | POA: Diagnosis present

## 2016-12-13 DIAGNOSIS — Z96649 Presence of unspecified artificial hip joint: Secondary | ICD-10-CM

## 2016-12-13 DIAGNOSIS — M169 Osteoarthritis of hip, unspecified: Secondary | ICD-10-CM | POA: Diagnosis present

## 2016-12-13 HISTORY — PX: TOTAL HIP ARTHROPLASTY: SHX124

## 2016-12-13 LAB — TYPE AND SCREEN
ABO/RH(D): O POS
Antibody Screen: NEGATIVE

## 2016-12-13 SURGERY — ARTHROPLASTY, HIP, TOTAL, ANTERIOR APPROACH
Anesthesia: Spinal | Site: Hip | Laterality: Right

## 2016-12-13 MED ORDER — DEXAMETHASONE SODIUM PHOSPHATE 10 MG/ML IJ SOLN
10.0000 mg | Freq: Once | INTRAMUSCULAR | Status: AC
  Administered 2016-12-13: 10 mg via INTRAVENOUS

## 2016-12-13 MED ORDER — OXYCODONE HCL 5 MG PO TABS
5.0000 mg | ORAL_TABLET | ORAL | Status: DC | PRN
  Administered 2016-12-13 (×2): 10 mg via ORAL
  Administered 2016-12-13 (×2): 5 mg via ORAL
  Administered 2016-12-14 (×3): 10 mg via ORAL
  Filled 2016-12-13 (×2): qty 2
  Filled 2016-12-13 (×2): qty 1
  Filled 2016-12-13 (×3): qty 2

## 2016-12-13 MED ORDER — LACTATED RINGERS IV SOLN
INTRAVENOUS | Status: DC
  Administered 2016-12-13 (×3): via INTRAVENOUS

## 2016-12-13 MED ORDER — MORPHINE SULFATE (PF) 2 MG/ML IV SOLN
1.0000 mg | INTRAVENOUS | Status: DC | PRN
  Administered 2016-12-13: 17:00:00 1 mg via INTRAVENOUS
  Filled 2016-12-13: qty 1

## 2016-12-13 MED ORDER — SODIUM CHLORIDE 0.9 % IV SOLN
1000.0000 mg | Freq: Once | INTRAVENOUS | Status: AC
  Administered 2016-12-13: 13:00:00 1000 mg via INTRAVENOUS
  Filled 2016-12-13: qty 10

## 2016-12-13 MED ORDER — HYDROMORPHONE HCL 1 MG/ML IJ SOLN
INTRAMUSCULAR | Status: AC
  Filled 2016-12-13: qty 1

## 2016-12-13 MED ORDER — PROPOFOL 10 MG/ML IV BOLUS
INTRAVENOUS | Status: AC
  Filled 2016-12-13: qty 40

## 2016-12-13 MED ORDER — METOCLOPRAMIDE HCL 5 MG/ML IJ SOLN
5.0000 mg | Freq: Three times a day (TID) | INTRAMUSCULAR | Status: DC | PRN
  Administered 2016-12-14: 13:00:00 10 mg via INTRAVENOUS
  Filled 2016-12-13: qty 2

## 2016-12-13 MED ORDER — RIVAROXABAN 10 MG PO TABS
10.0000 mg | ORAL_TABLET | Freq: Every day | ORAL | Status: DC
  Administered 2016-12-14 – 2016-12-15 (×2): 10 mg via ORAL
  Filled 2016-12-13 (×2): qty 1

## 2016-12-13 MED ORDER — PHENOL 1.4 % MT LIQD: 1.0000 | OROMUCOSAL | Status: DC | PRN

## 2016-12-13 MED ORDER — METHOCARBAMOL 500 MG PO TABS
500.0000 mg | ORAL_TABLET | Freq: Four times a day (QID) | ORAL | Status: DC | PRN
  Administered 2016-12-14 – 2016-12-15 (×2): 500 mg via ORAL
  Filled 2016-12-13 (×2): qty 1

## 2016-12-13 MED ORDER — FLEET ENEMA 7-19 GM/118ML RE ENEM: 1.0000 | ENEMA | Freq: Once | RECTAL | Status: DC | PRN

## 2016-12-13 MED ORDER — PROPOFOL 500 MG/50ML IV EMUL
INTRAVENOUS | Status: DC | PRN
  Administered 2016-12-13: 100 ug/kg/min via INTRAVENOUS

## 2016-12-13 MED ORDER — BUPIVACAINE HCL (PF) 0.25 % IJ SOLN
INTRAMUSCULAR | Status: DC | PRN
Start: 2016-12-13 — End: 2016-12-13
  Administered 2016-12-13: 30 mL

## 2016-12-13 MED ORDER — ONDANSETRON HCL 4 MG PO TABS: 4.0000 mg | ORAL_TABLET | Freq: Four times a day (QID) | ORAL | Status: DC | PRN

## 2016-12-13 MED ORDER — CEFAZOLIN SODIUM-DEXTROSE 2-4 GM/100ML-% IV SOLN
2.0000 g | INTRAVENOUS | Status: AC
  Administered 2016-12-13: 2 g via INTRAVENOUS
  Filled 2016-12-13: qty 100

## 2016-12-13 MED ORDER — ONDANSETRON HCL 4 MG/2ML IJ SOLN
4.0000 mg | Freq: Four times a day (QID) | INTRAMUSCULAR | Status: DC | PRN
  Administered 2016-12-13 – 2016-12-14 (×2): 4 mg via INTRAVENOUS
  Filled 2016-12-13 (×2): qty 2

## 2016-12-13 MED ORDER — ONDANSETRON HCL 4 MG/2ML IJ SOLN
INTRAMUSCULAR | Status: AC
  Filled 2016-12-13: qty 2

## 2016-12-13 MED ORDER — KETOROLAC TROMETHAMINE 15 MG/ML IJ SOLN
15.0000 mg | Freq: Once | INTRAMUSCULAR | Status: AC
  Administered 2016-12-13: 18:00:00 15 mg via INTRAVENOUS
  Filled 2016-12-13: qty 1

## 2016-12-13 MED ORDER — FENTANYL CITRATE (PF) 100 MCG/2ML IJ SOLN
INTRAMUSCULAR | Status: DC | PRN
  Administered 2016-12-13: 100 ug via INTRAVENOUS

## 2016-12-13 MED ORDER — MIDAZOLAM HCL 2 MG/2ML IJ SOLN
INTRAMUSCULAR | Status: AC
  Filled 2016-12-13: qty 2

## 2016-12-13 MED ORDER — DEXAMETHASONE SODIUM PHOSPHATE 10 MG/ML IJ SOLN
INTRAMUSCULAR | Status: AC
  Filled 2016-12-13: qty 1

## 2016-12-13 MED ORDER — DIAZEPAM 2 MG PO TABS
2.0000 mg | ORAL_TABLET | Freq: Four times a day (QID) | ORAL | Status: DC | PRN
  Administered 2016-12-13 – 2016-12-14 (×2): 2 mg via ORAL
  Filled 2016-12-13 (×2): qty 1

## 2016-12-13 MED ORDER — HYDROMORPHONE HCL 1 MG/ML IJ SOLN
0.2500 mg | INTRAMUSCULAR | Status: DC | PRN
  Administered 2016-12-13: 0.5 mg via INTRAVENOUS

## 2016-12-13 MED ORDER — TRAMADOL HCL 50 MG PO TABS
50.0000 mg | ORAL_TABLET | Freq: Four times a day (QID) | ORAL | Status: DC | PRN
  Administered 2016-12-15 (×3): 100 mg via ORAL
  Filled 2016-12-13 (×3): qty 2

## 2016-12-13 MED ORDER — MORPHINE SULFATE (PF) 10 MG/ML IV SOLN: 1.0000 mg | INTRAVENOUS | Status: DC | PRN

## 2016-12-13 MED ORDER — PROPOFOL 10 MG/ML IV BOLUS
INTRAVENOUS | Status: DC | PRN
  Administered 2016-12-13: 20 mg via INTRAVENOUS
  Administered 2016-12-13: 30 mg via INTRAVENOUS

## 2016-12-13 MED ORDER — ACETAMINOPHEN 650 MG RE SUPP: 650.0000 mg | Freq: Four times a day (QID) | RECTAL | Status: DC | PRN

## 2016-12-13 MED ORDER — BUPIVACAINE IN DEXTROSE 0.75-8.25 % IT SOLN
INTRATHECAL | Status: DC | PRN
  Administered 2016-12-13: 2 mL via INTRATHECAL

## 2016-12-13 MED ORDER — ACETAMINOPHEN 500 MG PO TABS
1000.0000 mg | ORAL_TABLET | Freq: Four times a day (QID) | ORAL | Status: AC
  Administered 2016-12-13 – 2016-12-14 (×4): 1000 mg via ORAL
  Filled 2016-12-13 (×4): qty 2

## 2016-12-13 MED ORDER — MIDAZOLAM HCL 5 MG/5ML IJ SOLN
INTRAMUSCULAR | Status: DC | PRN
  Administered 2016-12-13 (×2): 2 mg via INTRAVENOUS

## 2016-12-13 MED ORDER — PROMETHAZINE HCL 25 MG/ML IJ SOLN: 6.2500 mg | INTRAMUSCULAR | Status: DC | PRN

## 2016-12-13 MED ORDER — METHOCARBAMOL 1000 MG/10ML IJ SOLN
500.0000 mg | Freq: Four times a day (QID) | INTRAVENOUS | Status: DC | PRN
  Administered 2016-12-13: 500 mg via INTRAVENOUS
  Filled 2016-12-13: qty 5
  Filled 2016-12-13: qty 550

## 2016-12-13 MED ORDER — FENTANYL CITRATE (PF) 100 MCG/2ML IJ SOLN
INTRAMUSCULAR | Status: AC
  Filled 2016-12-13: qty 2

## 2016-12-13 MED ORDER — ACETAMINOPHEN 325 MG PO TABS: 650.0000 mg | ORAL_TABLET | Freq: Four times a day (QID) | ORAL | Status: DC | PRN

## 2016-12-13 MED ORDER — DEXAMETHASONE SODIUM PHOSPHATE 10 MG/ML IJ SOLN
10.0000 mg | Freq: Once | INTRAMUSCULAR | Status: AC
  Administered 2016-12-14: 11:00:00 10 mg via INTRAVENOUS
  Filled 2016-12-13: qty 1

## 2016-12-13 MED ORDER — MEPERIDINE HCL 50 MG/ML IJ SOLN: 6.2500 mg | INTRAMUSCULAR | Status: DC | PRN

## 2016-12-13 MED ORDER — POLYETHYLENE GLYCOL 3350 17 G PO PACK: 17.0000 g | PACK | Freq: Every day | ORAL | Status: DC | PRN

## 2016-12-13 MED ORDER — BISACODYL 10 MG RE SUPP: 10.0000 mg | Freq: Every day | RECTAL | Status: DC | PRN

## 2016-12-13 MED ORDER — PHENYLEPHRINE HCL 10 MG/ML IJ SOLN
INTRAMUSCULAR | Status: DC | PRN
  Administered 2016-12-13: 35 ug/min via INTRAVENOUS

## 2016-12-13 MED ORDER — DOCUSATE SODIUM 100 MG PO CAPS
100.0000 mg | ORAL_CAPSULE | Freq: Two times a day (BID) | ORAL | Status: DC
  Administered 2016-12-13 – 2016-12-15 (×4): 100 mg via ORAL
  Filled 2016-12-13 (×4): qty 1

## 2016-12-13 MED ORDER — PHENYLEPHRINE 40 MCG/ML (10ML) SYRINGE FOR IV PUSH (FOR BLOOD PRESSURE SUPPORT)
PREFILLED_SYRINGE | INTRAVENOUS | Status: AC
  Filled 2016-12-13: qty 10

## 2016-12-13 MED ORDER — DIPHENHYDRAMINE HCL 12.5 MG/5ML PO ELIX: 12.5000 mg | ORAL_SOLUTION | ORAL | Status: DC | PRN

## 2016-12-13 MED ORDER — TRANEXAMIC ACID 1000 MG/10ML IV SOLN
1000.0000 mg | INTRAVENOUS | Status: AC
  Administered 2016-12-13: 1000 mg via INTRAVENOUS
  Filled 2016-12-13: qty 10

## 2016-12-13 MED ORDER — ACETAMINOPHEN 10 MG/ML IV SOLN
1000.0000 mg | Freq: Once | INTRAVENOUS | Status: AC
  Administered 2016-12-13: 1000 mg via INTRAVENOUS
  Filled 2016-12-13: qty 100

## 2016-12-13 MED ORDER — PHENYLEPHRINE HCL 10 MG/ML IJ SOLN
INTRAMUSCULAR | Status: AC
  Filled 2016-12-13: qty 1

## 2016-12-13 MED ORDER — PANTOPRAZOLE SODIUM 40 MG PO TBEC: 40.0000 mg | DELAYED_RELEASE_TABLET | Freq: Every day | ORAL | Status: DC | PRN

## 2016-12-13 MED ORDER — ONDANSETRON HCL 4 MG/2ML IJ SOLN
INTRAMUSCULAR | Status: DC | PRN
  Administered 2016-12-13: 4 mg via INTRAVENOUS

## 2016-12-13 MED ORDER — CEFAZOLIN SODIUM-DEXTROSE 2-4 GM/100ML-% IV SOLN
INTRAVENOUS | Status: AC
  Filled 2016-12-13: qty 100

## 2016-12-13 MED ORDER — SODIUM CHLORIDE 0.9 % IV SOLN
INTRAVENOUS | Status: DC
  Administered 2016-12-13: 13:00:00 75 mL/h via INTRAVENOUS
  Administered 2016-12-14 (×4): via INTRAVENOUS

## 2016-12-13 MED ORDER — MENTHOL 3 MG MT LOZG: 1.0000 | LOZENGE | OROMUCOSAL | Status: DC | PRN

## 2016-12-13 MED ORDER — CHLORHEXIDINE GLUCONATE 4 % EX LIQD: 60.0000 mL | Freq: Once | CUTANEOUS | Status: DC

## 2016-12-13 MED ORDER — ACETAMINOPHEN 10 MG/ML IV SOLN
INTRAVENOUS | Status: AC
  Filled 2016-12-13: qty 100

## 2016-12-13 MED ORDER — PHENYLEPHRINE HCL 10 MG/ML IJ SOLN
INTRAMUSCULAR | Status: DC | PRN
  Administered 2016-12-13 (×2): 120 ug via INTRAVENOUS

## 2016-12-13 MED ORDER — CEFAZOLIN SODIUM-DEXTROSE 2-4 GM/100ML-% IV SOLN
2.0000 g | Freq: Four times a day (QID) | INTRAVENOUS | Status: AC
  Administered 2016-12-13 (×2): 2 g via INTRAVENOUS
  Filled 2016-12-13 (×2): qty 100

## 2016-12-13 MED ORDER — HYDROMORPHONE HCL 1 MG/ML IJ SOLN
INTRAMUSCULAR | Status: AC
  Administered 2016-12-13: 0.5 mg via INTRAVENOUS
  Filled 2016-12-13: qty 1

## 2016-12-13 MED ORDER — BUPIVACAINE HCL (PF) 0.25 % IJ SOLN
INTRAMUSCULAR | Status: AC
  Filled 2016-12-13: qty 30

## 2016-12-13 MED ORDER — PROPOFOL 10 MG/ML IV BOLUS
INTRAVENOUS | Status: AC
  Filled 2016-12-13: qty 60

## 2016-12-13 MED ORDER — METOCLOPRAMIDE HCL 5 MG PO TABS: 5.0000 mg | ORAL_TABLET | Freq: Three times a day (TID) | ORAL | Status: DC | PRN

## 2016-12-13 SURGICAL SUPPLY — 36 items
BAG DECANTER FOR FLEXI CONT (MISCELLANEOUS) ×3 IMPLANT
BAG ZIPLOCK 12X15 (MISCELLANEOUS) IMPLANT
BLADE SAG 18X100X1.27 (BLADE) ×3 IMPLANT
CAPT HIP TOTAL 2 ×3 IMPLANT
CLOSURE WOUND 1/2 X4 (GAUZE/BANDAGES/DRESSINGS) ×1
CLOTH BEACON ORANGE TIMEOUT ST (SAFETY) ×3 IMPLANT
COVER PERINEAL POST (MISCELLANEOUS) ×3 IMPLANT
DECANTER SPIKE VIAL GLASS SM (MISCELLANEOUS) ×3 IMPLANT
DRAPE STERI IOBAN 125X83 (DRAPES) ×3 IMPLANT
DRAPE U-SHAPE 47X51 STRL (DRAPES) ×6 IMPLANT
DRSG ADAPTIC 3X8 NADH LF (GAUZE/BANDAGES/DRESSINGS) ×3 IMPLANT
DRSG MEPILEX BORDER 4X4 (GAUZE/BANDAGES/DRESSINGS) ×3 IMPLANT
DRSG MEPILEX BORDER 4X8 (GAUZE/BANDAGES/DRESSINGS) ×3 IMPLANT
DURAPREP 26ML APPLICATOR (WOUND CARE) ×3 IMPLANT
ELECT REM PT RETURN 9FT ADLT (ELECTROSURGICAL) ×3
ELECTRODE REM PT RTRN 9FT ADLT (ELECTROSURGICAL) ×1 IMPLANT
EVACUATOR 1/8 PVC DRAIN (DRAIN) ×3 IMPLANT
GLOVE BIO SURGEON STRL SZ7.5 (GLOVE) ×3 IMPLANT
GLOVE BIO SURGEON STRL SZ8 (GLOVE) ×6 IMPLANT
GLOVE BIOGEL PI IND STRL 8 (GLOVE) ×2 IMPLANT
GLOVE BIOGEL PI INDICATOR 8 (GLOVE) ×4
GOWN STRL REUS W/TWL LRG LVL3 (GOWN DISPOSABLE) ×3 IMPLANT
GOWN STRL REUS W/TWL XL LVL3 (GOWN DISPOSABLE) ×3 IMPLANT
NS IRRIG 1000ML POUR BTL (IV SOLUTION) ×3 IMPLANT
PACK ANTERIOR HIP CUSTOM (KITS) ×3 IMPLANT
STRIP CLOSURE SKIN 1/2X4 (GAUZE/BANDAGES/DRESSINGS) ×2 IMPLANT
SUT ETHIBOND NAB CT1 #1 30IN (SUTURE) ×3 IMPLANT
SUT MNCRL AB 4-0 PS2 18 (SUTURE) ×3 IMPLANT
SUT VIC AB 2-0 CT1 27 (SUTURE) ×4
SUT VIC AB 2-0 CT1 TAPERPNT 27 (SUTURE) ×2 IMPLANT
SUT VLOC 180 0 24IN GS25 (SUTURE) ×3 IMPLANT
SYR 50ML LL SCALE MARK (SYRINGE) IMPLANT
TRAY FOLEY CATH 14FRSI W/METER (CATHETERS) ×3 IMPLANT
TRAY FOLEY W/METER SILVER 16FR (SET/KITS/TRAYS/PACK) IMPLANT
WATER STERILE IRR 1000ML POUR (IV SOLUTION) ×6 IMPLANT
YANKAUER SUCT BULB TIP 10FT TU (MISCELLANEOUS) ×3 IMPLANT

## 2016-12-13 NOTE — Anesthesia Procedure Notes (Signed)
Spinal  Patient location during procedure: OR End time: 12/13/2016 8:24 AM Staffing Performed: anesthesiologist  Preanesthetic Checklist Completed: patient identified, site marked, surgical consent, pre-op evaluation, timeout performed, IV checked, risks and benefits discussed and monitors and equipment checked Spinal Block Patient position: sitting Prep: Betadine and DuraPrep Patient monitoring: heart rate, continuous pulse ox and blood pressure Approach: right paramedian Location: L3-4 Injection technique: single-shot Needle Needle type: Pencan  Needle gauge: 25 G Needle length: 9 cm Assessment Sensory level: T4 Additional Notes Expiration date of kit checked and confirmed. Patient tolerated procedure well, without complications.

## 2016-12-13 NOTE — Anesthesia Postprocedure Evaluation (Addendum)
Anesthesia Post Note  Patient: Tammie Moore  Procedure(s) Performed: Procedure(s) (LRB): RIGHT TOTAL  HIP ARTHROPLASTY ANTERIOR APPROACH (Right)  Patient location during evaluation: PACU Anesthesia Type: Spinal Level of consciousness: awake and alert Pain management: pain level controlled Vital Signs Assessment: post-procedure vital signs reviewed and stable Respiratory status: spontaneous breathing and respiratory function stable Cardiovascular status: blood pressure returned to baseline and stable Postop Assessment: spinal receding Anesthetic complications: no       Last Vitals:  Vitals:   12/13/16 1145 12/13/16 1214  BP: 118/74 105/66  Pulse:  76  Resp:  14  Temp: 36.7 C 36.5 C    Last Pain:  Vitals:   12/13/16 1251  TempSrc:   PainSc: [place]

## 2016-12-13 NOTE — Addendum Note (Signed)
Addendum  created 12/13/16 1406 by Nolon Nations, MD   Sign clinical note, SmartForm saved

## 2016-12-13 NOTE — Evaluation (Signed)
Physical Therapy Evaluation Patient Details Name: Tammie Moore MRN: [id number] DOB: [birth year] Today's Date: 12/13/2016   History of Present Illness  Pt s/p R THR  Clinical Impression  Pt s/p R THR presents with decreased R LE strength/ROM and post op pain limiting functional mobility.  Pt should progress to dc home with family assist and HHPT follow up.    Follow Up Recommendations Home health PT    Equipment Recommendations  None recommended by PT    Recommendations for Other Services OT consult     Precautions / Restrictions Precautions Precautions: Fall Restrictions Weight Bearing Restrictions: No Other Position/Activity Restrictions: WBAT      Mobility  Bed Mobility Overal bed mobility: Needs Assistance Bed Mobility: Supine to Sit     Supine to sit: Min assist;+2 for physical assistance;+2 for safety/equipment     General bed mobility comments: cues for sequence and use of L LE to self assist  Transfers Overall transfer level: Needs assistance Equipment used: Rolling walker (2 wheeled) Transfers: Sit to/from Stand Sit to Stand: Min assist;Mod assist;From elevated surface         General transfer comment: cues for LE management and use of UEs to self assist  Ambulation/Gait Ambulation/Gait assistance: Min assist Ambulation Distance (Feet): 7 Feet Assistive device: Rolling walker (2 wheeled) Gait Pattern/deviations: Step-to pattern;Decreased step length - right;Decreased step length - left;Shuffle;Trunk flexed Gait velocity: decr Gait velocity interpretation: Below normal speed for age/gender General Gait Details: cues for sequence, posture and position from [employer]            Wheelchair Mobility    Modified Rankin (Stroke Patients Only)       Balance                                             Pertinent Vitals/Pain Pain Assessment: 0-10 Pain Score: 9  Pain Location: R hip Pain Descriptors / Indicators:  Aching;Sore Pain Intervention(s): Limited activity within patient's tolerance;Monitored during session;Premedicated before session;Ice applied    Home Living Family/patient expects to be discharged to:: Private residence Living Arrangements: Spouse/significant other Available Help at Discharge: Family Type of Home: House Home Access: Stairs to enter   Technical brewer of Steps: 1 Home Layout: One level Home Equipment: Environmental consultant - 2 wheels;Bedside commode      Prior Function Level of Independence: Independent               Hand Dominance        Extremity/Trunk Assessment   Upper Extremity Assessment Upper Extremity Assessment: Overall WFL for tasks assessed    Lower Extremity Assessment Lower Extremity Assessment: RLE deficits/detail    Cervical / Trunk Assessment Cervical / Trunk Assessment: Normal  Communication   Communication: No difficulties  Cognition Arousal/Alertness: Awake/alert Behavior During Therapy: WFL for tasks assessed/performed Overall Cognitive Status: Within Functional Limits for tasks assessed                      General Comments      Exercises Total Joint Exercises Ankle Circles/Pumps: AROM;Both;15 reps;Supine   Assessment/Plan    PT Assessment Patient needs continued PT services  PT Problem List Decreased strength;Decreased range of motion;Decreased activity tolerance;Decreased mobility;Decreased knowledge of use of DME;Pain          PT Treatment Interventions DME instruction;Gait training;Stair training;Functional mobility training;Therapeutic activities;Therapeutic exercise;Patient/family education  PT Goals (Current goals can be found in the Care Plan section)  Acute Rehab PT Goals Patient Stated Goal: Regain IND PT Goal Formulation: With patient Time For Goal Achievement: 12/16/16 Potential to Achieve Goals: Good    Frequency 7X/week   Barriers to discharge        Co-evaluation                End of Session Equipment Utilized During Treatment: Gait belt Activity Tolerance: Patient limited by pain Patient left: in chair;with call bell/phone within reach;with family/visitor present Nurse Communication: Mobility status         Time: 1625-1650 PT Time Calculation (min) (ACUTE ONLY): 25 min   Charges:   PT Evaluation $PT Eval Low Complexity: 1 Procedure PT Treatments $Gait Training: 8-22 mins   PT G Codes:        Avalene Sealy Dec 30, 2016, 6:05 PM

## 2016-12-13 NOTE — Interval H&P Note (Signed)
History and Physical Interval Note:  12/13/2016 8:14 AM  Tammie Moore  has presented today for surgery, with the diagnosis of RIGHT HIP OA  The various methods of treatment have been discussed with the patient and family. After consideration of risks, benefits and other options for treatment, the patient has consented to  Procedure(s): RIGHT TOTAL  HIP ARTHROPLASTY ANTERIOR APPROACH (Right) as a surgical intervention .  The patient's history has been reviewed, patient examined, no change in status, stable for surgery.  I have reviewed the patient's chart and labs.  Questions were answered to the patient's satisfaction.     Gearlean Alf

## 2016-12-13 NOTE — Transfer of Care (Signed)
Immediate Anesthesia Transfer of Care Note  Patient: Tammie Moore  Procedure(s) Performed: Procedure(s): RIGHT TOTAL  HIP ARTHROPLASTY ANTERIOR APPROACH (Right)  Patient Location: PACU  Anesthesia Type:Spinal  Level of Consciousness: awake, alert , oriented and patient cooperative  Airway & Oxygen Therapy: Patient Spontanous Breathing and Patient connected to face mask oxygen  Post-op Assessment: Report given to RN and Post -op Vital signs reviewed and stable  Post vital signs: stable  Last Vitals:  Vitals:   12/13/16 0649  BP: 129/79  Pulse: 64  Resp: 18  Temp: 36.4 C    Last Pain:  Vitals:   12/13/16 0715  TempSrc:   PainSc: 4       Patients Stated Pain Goal: 2 ([id number])  Complications: No apparent anesthesia complications  T8 spinal level

## 2016-12-13 NOTE — H&P (View-Only) (Signed)
Tammie Moore DOB: November 09, 1966 Married / Language: English / Race: White Female Date of Admission:  12/13/2016 CC:  Right Hip Pain History of Present Illness The patient is a 50 year old female who comes in for a preoperative History and Physical. The patient is scheduled for a right total hip arthroplasty (anterior) to be performed by Dr. Dione Plover. Aluisio, MD at [facility] on 12-13-2016. The patient is a 50 year old female who presented with a hip problem. The patient reports right hip problems including pain, catching and popping symptoms that have been present for many month(s). Symptoms reported include hip pain The patient reports symptoms radiating to the: right groin (around to right buttock. radiates down to right ankle). The patient describes the hip problem as sharp, dull, aching, throbbing, grinding, stabbing, shooting and burning.The symptoms are described as severe.The patient feels as if their symptoms are does feel they are worsening. Symptoms are exacerbated by movement, flexing hip, squatting, weight bearing, running, walking, sitting and lying on the affected side. Current treatment includes nonsteroidal anti-inflammatory drugs (Motrin and Valium). Previous workup for this problem has included hip MRI. First diagnosed with a right hip labral tear; referred to [place]. 2015 labral tear sx Dr. Aretha Parrot. 12 weeks post op pt. re-tore labarum. July 2016 second labral tendon graft sx. Dec. 2016 started having pain again. MRI Jan. 2017. She had an intra-articular cortisone injection in the right hip in May with no relief. She has trouble sleeping at night because of the pain. The pain is occurring in her hips at all times. She has had two arthroscopies and labral repairs with the second one being a labral autograft grafting of tendon and placing it in the area of the labrum. She states that she has pain at all times in that right hip. It is definitely limiting what she can and cannot  do. Unfortunately, this is all getting progressively worse with time. After her first scope with Dr. Aretha Parrot, she states that she was given the option of having the repeat scope and labral repair versus a hip replacement. She opted for the second arthroscopy and unfortunately has not had significant improvement. She did have an intra-articular hip injection in May and she said for a few weeks she had considerable improvements and was able to do a lot more, but then it wore off real quickly. She did not have full resolution of her pain, but it got better to the point where she was able to do more and she was satisfied with how she felt during that time span. She is currently not having any left hip pain. Unfortunately, she does have a significant problem with the hip. I did review her scope, pictures and she had a considerable amount of acetabular degeneration in that second scope. It appears that the arthroscopic pictures showed a more advanced situation than what the plain films show. Unfortunately, she is not getting better at all. The injection did help some which suggest that the process is intra-articular in nature. At this point, she really feels like she needs to do something. I would not recommend another arthroscopy. I would recommend that she pursue total hip arthroplasty because she is definitely heading quickly in that direction. We did discuss that in detail today procedure risks, potential complications, rehab course and she would like to go ahead and proceed. They have been treated conservatively in the past for the above stated problem and despite conservative measures, they continue to have progressive pain  and severe functional limitations and dysfunction. They have failed non-operative management including home exercise, medications, and injections. It is felt that they would benefit from undergoing total joint replacement. Risks and benefits of the procedure have been discussed with the patient  and they elect to proceed with surgery. There are no active contraindications to surgery such as ongoing infection or rapidly progressive neurological disease.   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  Family History  Cancer  Mother. Congestive Heart Failure  Mother. Diabetes Mellitus  Mother. First Degree Relatives  reported Heart Disease  Father. Heart disease in female family member before age 36   Social History Children  4 Current drinker  11/25/2013: Currently drinks beer and wine only occasionally per week Current work status  working part time Exercise  Exercises weekly; does running / walking Living situation  live with spouse Marital status  married No history of drug/alcohol rehab  Not under pain contract  Number of flights of stairs before winded  greater than 5 Tobacco use  Never smoker. 11/25/2013 Advance Directives  Living Will, Healthcare POA  Medication History Motrin IB (Oral) Specific strength unknown - Active. Mirena (20MCG/24HR IUD, Intrauterine) Active. Valium (10MG  Tablet, Oral) Active. Protonix (40MG  Tablet DR, Oral as needed) Active.   Past Surgical History Breast Reconstruction  bilateral Tonsillectomy    Review of Systems General Not Present- Chills, Fatigue, Fever, Memory Loss, Night Sweats, Weight Gain and Weight Loss. Skin Not Present- Eczema, Hives, Itching, Lesions and Rash. HEENT Not Present- Dentures, Double Vision, Headache, Hearing Loss, Tinnitus and Visual Loss. Respiratory Not Present- Allergies, Chronic Cough, Coughing up blood, Shortness of breath at rest and Shortness of breath with exertion. Cardiovascular Not Present- Chest Pain, Difficulty Breathing Lying Down, Murmur, Palpitations, Racing/skipping  heartbeats and Swelling. Gastrointestinal Not Present- Abdominal Pain, Bloody Stool, Constipation, Diarrhea, Difficulty Swallowing, Heartburn, Jaundice, Loss of appetitie, Nausea and Vomiting. Female Genitourinary Not Present- Blood in Urine, Discharge, Flank Pain, Incontinence, Painful Urination, Urgency, Urinary frequency, Urinary Retention, Urinating at Night and Weak urinary stream. Musculoskeletal Present- Back Pain, Joint Pain, Morning Stiffness, Muscle Pain and Spasms. Not Present- Joint Swelling and Muscle Weakness. Neurological Not Present- Blackout spells, Difficulty with balance, Dizziness, Paralysis, Tremor and Weakness. Psychiatric Not Present- Insomnia.  Vitals Weight: 148 lb Height: 65in Body Surface Area: 1.74 m Body Mass Index: 24.63 kg/m  Pulse: 64 (Regular)  BP: 118/58 (Sitting, Right Arm, Standard)   Physical Exam  General Mental Status -Alert, cooperative and good historian. General Appearance-pleasant, Not in acute distress. Orientation-Oriented X3. Build & Nutrition-Well nourished and Well developed.  Head and Neck Head-normocephalic, atraumatic . Neck Global Assessment - supple, no bruit auscultated on the right, no bruit auscultated on the left.  Eye Vision-Wears contact lenses. Pupil - Bilateral-Regular and Round. Motion - Bilateral-EOMI.  Chest and Lung Exam Auscultation Breath sounds - clear at anterior chest wall and clear at posterior chest wall. Adventitious sounds - No Adventitious sounds.  Cardiovascular Auscultation Rhythm - Regular rate and rhythm. Heart Sounds - S1 WNL and S2 WNL. Murmurs & Other Heart Sounds - Auscultation of the heart reveals - No Murmurs.  Abdomen Palpation/Percussion Tenderness - Abdomen is non-tender to palpation. Rigidity (guarding) - Abdomen is soft. Auscultation Auscultation of the abdomen reveals - Bowel sounds normal.  Female Genitourinary Note: Not done, not pertinent  to present  illness   Musculoskeletal Note: Very pleasant, well-developed female, alert and oriented, in no apparent distress. Evaluation of her left hip shows normal range of motion without any discomfort. Right hip can be flexed to about 130, rotated in 30, out 40, abducted 40 with pain on flexion and rotation of the hip both internal and external rotation. She does not have any trochanteric tenderness. She does have an antalgic gait pattern on the right. She had a paper copy of an x-ray taken in January of this year did show some superior joint space narrowing and significant sclerosis of the superior acetabulum. X-rays taken today shows about the same appearance of the joint with some narrowing superiorly and with the sclerosis in the sourcil area of the acetabulum.  Assessment & Plan Primary osteoarthritis of right hip (M16.11)  Note:Surgical Plans: Right Total Hip Replacement - Anterior Approach  Disposition: Home  PCP: Carley Hammed, FNP  IV TXA  Anesthesia Issues: Nausea and vomiting following hip scope.  Signed electronically by Ok Edwards, III PA-C

## 2016-12-13 NOTE — Op Note (Signed)
OPERATIVE REPORT- TOTAL HIP ARTHROPLASTY   PREOPERATIVE DIAGNOSIS: Osteoarthritis of the Right hip.   POSTOPERATIVE DIAGNOSIS: Osteoarthritis of the Right  hip.   PROCEDURE: Right total hip arthroplasty, anterior approach.   SURGEON: Gaynelle Arabian, MD   ASSISTANT: Arlee Muslim, PA-C  ANESTHESIA:  Spinal  ESTIMATED BLOOD LOSS:-350 ml    DRAINS: Hemovac x1.   COMPLICATIONS: None   CONDITION: PACU - hemodynamically stable.   BRIEF CLINICAL NOTE: Tammie Moore is a 50 y.o. female who has advanced end-  stage arthritis of their Right  hip with progressively worsening pain and  dysfunction.The patient has failed nonoperative management and presents for  total hip arthroplasty.   PROCEDURE IN DETAIL: After successful administration of spinal  anesthetic, the traction boots for the [facility] bed were placed on both  feet and the patient was placed onto the [facility] bed, boots placed into the leg  holders. The Right hip was then isolated from the perineum with plastic  drapes and prepped and draped in the usual sterile fashion. ASIS and  greater trochanter were marked and a oblique incision was made, starting  at about 1 cm lateral and 2 cm distal to the ASIS and coursing towards  the anterior cortex of the femur. The skin was cut with a 10 blade  through subcutaneous tissue to the level of the fascia overlying the  tensor fascia lata muscle. The fascia was then incised in line with the  incision at the junction of the anterior third and posterior 2/3rd. The  muscle was teased off the fascia and then the interval between the TFL  and the rectus was developed. The Hohmann retractor was then placed at  the top of the femoral neck over the capsule. The vessels overlying the  capsule were cauterized and the fat on top of the capsule was removed.  A Hohmann retractor was then placed anterior underneath the rectus  femoris to give exposure to the entire anterior capsule. A T-shaped   capsulotomy was performed. The edges were tagged and the femoral head  was identified.       Osteophytes are removed off the superior acetabulum.  The femoral neck was then cut in situ with an oscillating saw. Traction  was then applied to the left lower extremity utilizing the [facility]  traction. The femoral head was then removed. Retractors were placed  around the acetabulum and then circumferential removal of the labrum was  performed. Osteophytes were also removed. Reaming starts at 43 mm to  medialize and  Increased in 2 mm increments to 47 mm. We reamed in  approximately 40 degrees of abduction, 20 degrees anteversion. A 48 mm  pinnacle acetabular shell was then impacted in anatomic position under  fluoroscopic guidance with excellent purchase. We did not need to place  any additional dome screws. A 28 mm neutral + 4 marathon liner was then  placed into the acetabular shell.       The femoral lift was then placed along the lateral aspect of the femur  just distal to the vastus ridge. The leg was  externally rotated and capsule  was stripped off the inferior aspect of the femoral neck down to the  level of the lesser trochanter, this was done with electrocautery. The femur was lifted after this was performed. The  leg was then placed in an extended and adducted position essentially delivering the femur. We also removed the capsule superiorly and the piriformis from the piriformis  fossa to gain excellent exposure of the  proximal femur. Rongeur was used to remove some cancellous bone to get  into the lateral portion of the proximal femur for placement of the  initial starter reamer. The starter broaches was placed  the starter broach  and was shown to go down the center of the canal. Broaching  with the  Corail system was then performed starting at size 8, coursing  Up to size 10. A size 10 had excellent torsional and rotational  and axial stability. The trial standard offset neck was then  placed  with a 28 + 1.5 trial head. The hip was then reduced. We confirmed that  the stem was in the canal both on AP and lateral x-rays. It also has excellent sizing. The hip was reduced with outstanding stability through full extension and full external rotation.. AP pelvis was taken and the leg lengths were measured and found to be equal. Hip was then dislocated again and the femoral head and neck removed. The  femoral broach was removed. Size 10 Corail stem with a standard offset  neck was then impacted into the femur following native anteversion. Has  excellent purchase in the canal. Excellent torsional and rotational and  axial stability. It is confirmed to be in the canal on AP and lateral  fluoroscopic views. The 28 + 1.5 ceramic head was placed and the hip  reduced with outstanding stability. Again AP pelvis was taken and it  confirmed that the leg lengths were equal. The wound was then copiously  irrigated with saline solution and the capsule reattached and repaired  with Ethibond suture. 30 ml of .25% Bupivicaine was  injected into the capsule and into the edge of the tensor fascia lata as well as subcutaneous tissue. The fascia overlying the tensor fascia lata was then closed with a running #1 V-Loc. Subcu was closed with interrupted 2-0 Vicryl and subcuticular running 4-0 Monocryl. Incision was cleaned  and dried. Steri-Strips and a bulky sterile dressing applied. Hemovac  drain was hooked to suction and then the patient was awakened and transported to  recovery in stable condition.        Please note that a surgical assistant was a medical necessity for this procedure to perform it in a safe and expeditious manner. Assistant was necessary to provide appropriate retraction of vital neurovascular structures and to prevent femoral fracture and allow for anatomic placement of the prosthesis.  Gaynelle Arabian, M.D.

## 2016-12-14 LAB — CBC
HCT: 33 % — ABNORMAL LOW (ref 36.0–46.0)
Hemoglobin: 11.4 g/dL — ABNORMAL LOW (ref 12.0–15.0)
MCH: 29.2 pg (ref 26.0–34.0)
MCHC: 34.5 g/dL (ref 30.0–36.0)
MCV: 84.4 fL (ref 78.0–100.0)
PLATELETS: 246 10*3/uL (ref 150–400)
RBC: 3.91 MIL/uL (ref 3.87–5.11)
RDW: 12.9 % (ref 11.5–15.5)
WBC: 23.2 10*3/uL — ABNORMAL HIGH (ref 4.0–10.5)

## 2016-12-14 LAB — BASIC METABOLIC PANEL
Anion gap: 3 — ABNORMAL LOW (ref 5–15)
BUN: 8 mg/dL (ref 6–20)
CHLORIDE: 113 mmol/L — AB (ref 101–111)
CO2: 23 mmol/L (ref 22–32)
CREATININE: 0.8 mg/dL (ref 0.44–1.00)
Calcium: 9.2 mg/dL (ref 8.9–10.3)
GFR calc Af Amer: >60 mL/min (ref >60)
GLUCOSE: 118 mg/dL — AB (ref 65–99)
POTASSIUM: 4.3 mmol/L (ref 3.5–5.1)
Sodium: 139 mmol/L (ref 135–145)

## 2016-12-14 MED ORDER — HYDROCODONE-ACETAMINOPHEN 5-325 MG PO TABS
1.0000 | ORAL_TABLET | ORAL | Status: DC | PRN
Start: 2016-12-14 — End: 2016-12-15
  Administered 2016-12-14 – 2016-12-15 (×5): 2 via ORAL
  Filled 2016-12-14 (×5): qty 2

## 2016-12-14 NOTE — Care Management Note (Signed)
Case Management Note  Patient Details  Name: REXANNE INOCENCIO MRN: [id number] Date of Birth: [birth year]  Subjective/Objective:                  s/p R THR Action/Plan: Discharge planning Expected Discharge Date:  12/14/2016              Expected Discharge Plan:  [place]  In-House Referral:     Discharge planning Services  CM Consult  Post Acute Care Choice:  Durable Medical Equipment, Home Health Choice offered to:  Patient, Spouse  DME Arranged:  Walker rolling DME Agency:     HH Arranged:  PT HH Agency:  Other - See comment  Status of Service:  Completed, signed off  If discussed at [place] of Stay Meetings, dates discussed:    Additional Comments: CM met with pt in room to explain CM has exhausted all options in her area for HHPT; and, unfortunately, because of the holiday, all Home Health agencies are declining referral because of no staffing.  This includes [facility], [place] and [facility] agencies, [facility], [facility], [facility], [facility].  Pt states she feels comfortable going home with exercises from PT here and if MD does not feel comfortable, she has a friend who is an outpt PT and MD can write prescription for outpt PT.  CM made PT and RN aware and left word for MD.  CM notified [facility] rep, Larene Beach to please deliver the rolling walker to room.  No other CM needs were communicated.   Dellie Catholic, RN 12/14/2016, 2:52 PM

## 2016-12-14 NOTE — Discharge Instructions (Addendum)
° °Dr. Frank Aluisio °Total Joint Specialist °[facility] °[address] °[place], [place] [address] °[phone] ° °ANTERIOR APPROACH TOTAL HIP REPLACEMENT POSTOPERATIVE DIRECTIONS ° ° °Hip Rehabilitation, Guidelines Following Surgery  °The results of a hip operation are greatly improved after range of motion and muscle strengthening exercises. Follow all safety measures which are given to protect your hip. If any of these exercises cause increased pain or swelling in your joint, decrease the amount until you are comfortable again. Then slowly increase the exercises. Call your caregiver if you have problems or questions.  ° °HOME CARE INSTRUCTIONS  °Remove items at home which could result in a fall. This includes throw rugs or furniture in walking pathways.  °· ICE to the affected hip every three hours for 30 minutes at a time and then as needed for pain and swelling.  Continue to use ice on the hip for pain and swelling from surgery. You may notice swelling that will progress down to the foot and ankle.  This is normal after surgery.  Elevate the leg when you are not up walking on it.   °· Continue to use the breathing machine which will help keep your temperature down.  It is common for your temperature to cycle up and down following surgery, especially at night when you are not up moving around and exerting yourself.  The breathing machine keeps your lungs expanded and your temperature down. ° ° °DIET °You may resume your previous home diet once your are discharged from the hospital. ° °DRESSING / WOUND CARE / SHOWERING °You may shower 3 days after surgery, but keep the wounds dry during showering.  You may use an occlusive plastic wrap (Press'n Seal for example), NO SOAKING/SUBMERGING IN THE BATHTUB.  If the bandage gets wet, change with a clean dry gauze.  If the incision gets wet, pat the wound dry with a clean towel. °You may start showering once you are discharged home but do not  submerge the incision under water. Just pat the incision dry and apply a dry gauze dressing on daily. °Change the surgical dressing daily and reapply a dry dressing each time. ° °ACTIVITY °Walk with your walker as instructed. °Use walker as long as suggested by your caregivers. °Avoid periods of inactivity such as sitting longer than an hour when not asleep. This helps prevent blood clots.  °You may resume a sexual relationship in one month or when given the OK by your doctor.  °You may return to work once you are cleared by your doctor.  °Do not drive a car for 6 weeks or until released by you surgeon.  °Do not drive while taking narcotics. ° °WEIGHT BEARING °Weight bearing as tolerated with assist device (walker, cane, etc) as directed, use it as long as suggested by your surgeon or therapist, typically at least 4-6 weeks. ° °POSTOPERATIVE CONSTIPATION PROTOCOL °Constipation - defined medically as fewer than three stools per week and severe constipation as less than one stool per week. ° °One of the most common issues patients have following surgery is constipation.  Even if you have a regular bowel pattern at home, your normal regimen is likely to be disrupted due to multiple reasons following surgery.  Combination of anesthesia, postoperative narcotics, change in appetite and fluid intake all can affect your bowels.  In order to avoid complications following surgery, here are some recommendations in order to help you during your recovery period. ° °Colace (docusate) - Pick up an over-the-counter   form of Colace or another stool softener and take twice a day as long as you are requiring postoperative pain medications.  Take with a full glass of water daily.  If you experience loose stools or diarrhea, hold the colace until you stool forms back up.  If your symptoms do not get better within 1 week or if they get worse, check with your doctor. ° °Dulcolax (bisacodyl) - Pick up over-the-counter and take as directed  by the product packaging as needed to assist with the movement of your bowels.  Take with a full glass of water.  Use this product as needed if not relieved by Colace only.  ° °MiraLax (polyethylene glycol) - Pick up over-the-counter to have on hand.  MiraLax is a solution that will increase the amount of water in your bowels to assist with bowel movements.  Take as directed and can mix with a glass of water, juice, soda, coffee, or tea.  Take if you go more than two days without a movement. °Do not use MiraLax more than once per day. Call your doctor if you are still constipated or irregular after using this medication for 7 days in a row. ° °If you continue to have problems with postoperative constipation, please contact the office for further assistance and recommendations.  If you experience "the worst abdominal pain ever" or develop nausea or vomiting, please contact the office immediatly for further recommendations for treatment. ° °ITCHING ° If you experience itching with your medications, try taking only a single pain pill, or even half a pain pill at a time.  You can also use Benadryl over the counter for itching or also to help with sleep.  ° °TED HOSE STOCKINGS °Wear the elastic stockings on both legs for three weeks following surgery during the day but you may remove then at night for sleeping. ° °MEDICATIONS °See your medication summary on the “After Visit Summary” that the nursing staff will review with you prior to discharge.  You may have some home medications which will be placed on hold until you complete the course of blood thinner medication.  It is important for you to complete the blood thinner medication as prescribed by your surgeon.  Continue your approved medications as instructed at time of discharge. ° °PRECAUTIONS °If you experience chest pain or shortness of breath - call 911 immediately for transfer to the hospital emergency department.  °If you develop a fever greater that 101 F,  purulent drainage from wound, increased redness or drainage from wound, foul odor from the wound/dressing, or calf pain - CONTACT YOUR SURGEON.   °                                                °FOLLOW-UP APPOINTMENTS °Make sure you keep all of your appointments after your operation with your surgeon and caregivers. You should call the office at the above phone number and make an appointment for approximately two weeks after the date of your surgery or on the date instructed by your surgeon outlined in the "After Visit Summary". ° °RANGE OF MOTION AND STRENGTHENING EXERCISES  °These exercises are designed to help you keep full movement of your hip joint. Follow your caregiver's or physical therapist's instructions. Perform all exercises about fifteen times, three times per day or as directed. Exercise both hips, even if you   have had only one joint replacement. These exercises can be done on a training (exercise) mat, on the floor, on a table or on a bed. Use whatever works the best and is most comfortable for you. Use music or television while you are exercising so that the exercises are a pleasant break in your day. This will make your life better with the exercises acting as a break in routine you can look forward to.  Lying on your back, slowly slide your foot toward your buttocks, raising your knee up off the floor. Then slowly slide your foot back down until your leg is straight again.  Lying on your back spread your legs as far apart as you can without causing discomfort.  Lying on your side, raise your upper leg and foot straight up from the floor as far as is comfortable. Slowly lower the leg and repeat.  Lying on your back, tighten up the muscle in the front of your thigh (quadriceps muscles). You can do this by keeping your leg straight and trying to raise your heel off the floor. This helps strengthen the largest muscle supporting your knee.  Lying on your back, tighten up the muscles of your  buttocks both with the legs straight and with the knee bent at a comfortable angle while keeping your heel on the floor.   IF YOU ARE TRANSFERRED TO A SKILLED REHAB FACILITY If the patient is transferred to a skilled rehab facility following release from the hospital, a list of the current medications will be sent to the facility for the patient to continue.  When discharged from the skilled rehab facility, please have the facility set up the patient's [place] prior to being released. Also, the skilled facility will be responsible for providing the patient with their medications at time of release from the facility to include their pain medication, the muscle relaxants, and their blood thinner medication. If the patient is still at the rehab facility at time of the two week follow up appointment, the skilled rehab facility will also need to assist the patient in arranging follow up appointment in our office and any transportation needs.  MAKE SURE YOU:  Understand these instructions.  Get help right away if you are not doing well or get worse.    Pick up stool softner and laxative for home use following surgery while on pain medications. Do not submerge incision under water. Please use good hand washing techniques while changing dressing each day. May shower starting three days after surgery. Please use a clean towel to pat the incision dry following showers. Continue to use ice for pain and swelling after surgery. Do not use any lotions or creams on the incision until instructed by your surgeon.  Take Xarelto for two and a half more weeks, then discontinue Xarelto. Once the patient has completed the blood thinner regimen, then take a Baby 81 mg Aspirin daily for three more weeks.   Information on my medicine - XARELTO (Rivaroxaban)  This medication education was reviewed with me or my healthcare representative as part of my discharge preparation.    Why was Xarelto  prescribed for you? Xarelto was prescribed for you to reduce the risk of blood clots forming after orthopedic surgery. The medical term for these abnormal blood clots is venous thromboembolism (VTE).  What do you need to know about xarelto ? Take your Xarelto ONCE DAILY at the same time every day. You may take it either with or  without food.  If you have difficulty swallowing the tablet whole, you may crush it and mix in applesauce just prior to taking your dose.  Take Xarelto exactly as prescribed by your doctor and DO NOT stop taking Xarelto without talking to the doctor who prescribed the medication.  Stopping without other VTE prevention medication to take the place of Xarelto may increase your risk of developing a clot.  After discharge, you should have regular check-up appointments with your healthcare provider that is prescribing your Xarelto.    What do you do if you miss a dose? If you miss a dose, take it as soon as you remember on the same day then continue your regularly scheduled once daily regimen the next day. Do not take two doses of Xarelto on the same day.   Important Safety Information A possible side effect of Xarelto is bleeding. You should call your healthcare provider right away if you experience any of the following: ? Bleeding from an injury or your nose that does not stop. ? Unusual colored urine (red or dark brown) or unusual colored stools (red or black). ? Unusual bruising for unknown reasons. ? A serious fall or if you hit your head (even if there is no bleeding).  Some medicines may interact with Xarelto and might increase your risk of bleeding while on Xarelto. To help avoid this, consult your healthcare provider or pharmacist prior to using any new prescription or non-prescription medications, including herbals, vitamins, non-steroidal anti-inflammatory drugs (NSAIDs) and supplements.  This website has more information on Xarelto:  [email or web address]

## 2016-12-14 NOTE — Progress Notes (Signed)
Physical Therapy Treatment Patient Details Name: Tammie Moore MRN: [id number] DOB: Oct 20, 1966 Today's Date: 12/14/2016    History of Present Illness Pt s/p R THR    PT Comments    Pt progressing well with mobility and hopeful for dc home tomorrow.  Follow Up Recommendations  Home health PT     Equipment Recommendations  None recommended by PT    Recommendations for Other Services OT consult     Precautions / Restrictions Precautions Precautions: Fall Restrictions Weight Bearing Restrictions: No Other Position/Activity Restrictions: WBAT    Mobility  Bed Mobility Overal bed mobility: Needs Assistance Bed Mobility: Sit to Supine     Supine to sit: Min assist Sit to supine: Min guard   General bed mobility comments: cues for sequence and use of L LE to assist R LE  Transfers Overall transfer level: Needs assistance Equipment used: Rolling walker (2 wheeled) Transfers: Sit to/from Stand Sit to Stand: Min guard         General transfer comment: cues for UE/LE placement  Ambulation/Gait Ambulation/Gait assistance: Min guard Ambulation Distance (Feet): 300 Feet Assistive device: Rolling walker (2 wheeled) Gait Pattern/deviations: Step-to pattern;Step-through pattern;Shuffle;Trunk flexed Gait velocity: decr Gait velocity interpretation: Below normal speed for age/gender General Gait Details: min cues for posture and position from [employer]            Wheelchair Mobility    Modified Rankin (Stroke Patients Only)       Balance                                    Cognition Arousal/Alertness: Awake/alert Behavior During Therapy: WFL for tasks assessed/performed Overall Cognitive Status: Within Functional Limits for tasks assessed                      Exercises Total Joint Exercises Ankle Circles/Pumps: AROM;Both;15 reps;Supine Quad Sets: AROM;Both;10 reps;Supine Heel Slides: AAROM;Right;20 reps;Supine Hip  ABduction/ADduction: AAROM;Right;15 reps;Supine    General Comments        Pertinent Vitals/Pain Pain Assessment: 0-10 Pain Score: 4  Faces Pain Scale: Hurts whole lot Pain Location: R hip Pain Descriptors / Indicators: Aching Pain Intervention(s): Limited activity within patient's tolerance;Monitored during session;Premedicated before session;Ice applied    Home Living Family/patient expects to be discharged to:: Private residence Living Arrangements: Spouse/significant other Available Help at Discharge: Family         Home Equipment: Bedside commode;Tub bench      Prior Function Level of Independence: Independent          PT Goals (current goals can now be found in the care plan section) Acute Rehab PT Goals Patient Stated Goal: Regain IND PT Goal Formulation: With patient Time For Goal Achievement: 12/16/16 Potential to Achieve Goals: Good Progress towards PT goals: Progressing toward goals    Frequency    7X/week      PT Plan Current plan remains appropriate    Co-evaluation             End of Session Equipment Utilized During Treatment: Gait belt Activity Tolerance: Patient tolerated treatment well Patient left: in bed;with call bell/phone within reach     Time: 1242-1313 PT Time Calculation (min) (ACUTE ONLY): 31 min  Charges:  $Gait Training: 8-22 mins $Therapeutic Exercise: 8-22 mins  G Codes:      Maxamillian Tienda 01/01/17, 1:44 PM

## 2016-12-14 NOTE — Progress Notes (Signed)
   Subjective: 1 Day Post-Op Procedure(s) (LRB): RIGHT TOTAL  HIP ARTHROPLASTY ANTERIOR APPROACH (Right) Patient reports pain as mild.   Patient seen in rounds for Dr. Wynelle Link. Patient is well, but has had some minor complaints of pain in the hip, requiring pain medications We will resume therapy today.  She walked a few steps the day of surgery. Plan is to go Home after hospital stay.  Objective: Vital signs in last 24 hours: Temp:  [98.2 F (36.8 C)-99.3 F (37.4 C)] 98.2 F (36.8 C) (12/21 1346) Pulse Rate:  [59-84] 73 (12/21 1346) Resp:  [14-16] 14 (12/21 1346) BP: (88-112)/(50-71) 106/63 (12/21 1346) SpO2:  [96 %-100 %] 100 % (12/21 1346)  Intake/Output from previous day:  Intake/Output Summary (Last 24 hours) at 12/14/16 1524 Last data filed at 12/14/16 1347  Gross per 24 hour  Intake           2182.5 ml  Output             4095 ml  Net          -1912.5 ml    Intake/Output this shift: Total I/O In: 360 [P.O.:360] Out: 800 [Urine:800]  Labs:  Recent Labs  12/14/16 0428  HGB 11.4*    Recent Labs  12/14/16 0428  WBC 23.2*  RBC 3.91  HCT 33.0*  PLT 246    Recent Labs  12/14/16 0428  NA 139  K 4.3  CL 113*  CO2 23  BUN 8  CREATININE 0.80  GLUCOSE 118*  CALCIUM 9.2   No results for input(s): LABPT, INR in the last 72 hours.  EXAM General - Patient is Alert, Appropriate and Oriented Extremity - Neurovascular intact Sensation intact distally Intact pulses distally Dorsiflexion/Plantar flexion intact Dressing - dressing C/D/I Motor Function - intact, moving foot and toes well on exam.  Hemovac pulled without difficulty.  Past Medical History:  Diagnosis Date  . Arthritis   . Esophagitis due to doxycycline   . Gastric ulcer 05/2015  . PONV (postoperative nausea and vomiting)     Assessment/Plan: 1 Day Post-Op Procedure(s) (LRB): RIGHT TOTAL  HIP ARTHROPLASTY ANTERIOR APPROACH (Right) Principal Problem:   OA (osteoarthritis) of  hip  Estimated body mass index is 24.79 kg/m as calculated from the following:   Height as of this encounter: 5\' 5"  (1.651 m).   Weight as of this encounter: 67.6 kg (149 lb). Advance diet Up with therapy Plan for discharge tomorrow  DVT Prophylaxis - Xarelto Weight Bearing As Tolerated right Leg Hemovac Pulled Begin Therapy  Arlee Muslim, PA-C Orthopaedic Surgery 12/14/2016, 3:24 PM

## 2016-12-14 NOTE — Progress Notes (Signed)
Physical Therapy Treatment Patient Details Name: Tammie Moore MRN: [id number] DOB: 16-Dec-1966 Today's Date: 2017/01/01    History of Present Illness Pt s/p R THR    PT Comments    Pt progressing with mobility but concerned regarding possible leg length discrepancy.   Follow Up Recommendations  Home health PT     Equipment Recommendations  None recommended by PT    Recommendations for Other Services OT consult     Precautions / Restrictions Precautions Precautions: Fall Restrictions Weight Bearing Restrictions: No Other Position/Activity Restrictions: WBAT    Mobility  Bed Mobility Overal bed mobility: Needs Assistance Bed Mobility: Supine to Sit     Supine to sit: Min assist     General bed mobility comments: cues for sequence and assist for RLE  Transfers Overall transfer level: Needs assistance Equipment used: Rolling walker (2 wheeled) Transfers: Sit to/from Stand Sit to Stand: Min assist         General transfer comment: cues for UE/LE placement  Ambulation/Gait Ambulation/Gait assistance: Min assist Ambulation Distance (Feet): 130 Feet Assistive device: Rolling walker (2 wheeled) Gait Pattern/deviations: Decreased step length - right;Decreased step length - left;Shuffle;Trunk flexed;Step-to pattern;Step-through pattern Gait velocity: decr Gait velocity interpretation: Below normal speed for age/gender General Gait Details: cues for sequence, posture and position from [employer]            Wheelchair Mobility    Modified Rankin (Stroke Patients Only)       Balance                                    Cognition Arousal/Alertness: Awake/alert Behavior During Therapy: WFL for tasks assessed/performed Overall Cognitive Status: Within Functional Limits for tasks assessed                      Exercises      General Comments        Pertinent Vitals/Pain Pain Assessment: 0-10 Pain Score: 5  Faces  Pain Scale: Hurts whole lot Pain Location: R hip Pain Descriptors / Indicators: Aching Pain Intervention(s): Limited activity within patient's tolerance;Monitored during session;Premedicated before session;Ice applied    Home Living Family/patient expects to be discharged to:: Private residence Living Arrangements: Spouse/significant other Available Help at Discharge: Family         Home Equipment: Bedside commode;Tub bench      Prior Function Level of Independence: Independent          PT Goals (current goals can now be found in the care plan section) Acute Rehab PT Goals Patient Stated Goal: Regain IND PT Goal Formulation: With patient Time For Goal Achievement: 12/16/16 Potential to Achieve Goals: Good Progress towards PT goals: Progressing toward goals    Frequency    7X/week      PT Plan Current plan remains appropriate    Co-evaluation             End of Session Equipment Utilized During Treatment: Gait belt Activity Tolerance: Patient limited by fatigue Patient left: in chair;with call bell/phone within reach;with family/visitor present     Time: [id number] PT Time Calculation (min) (ACUTE ONLY): 16 min  Charges:  $Gait Training: 8-22 mins                    G Codes:      Tammie Moore January 01, 2017, 12:11 PM

## 2016-12-14 NOTE — Evaluation (Signed)
Occupational Therapy Evaluation Patient Details Name: Tammie Moore MRN: [id number] DOB: February 14, 1966 Today's Date: 12/14/2016    History of Present Illness Pt s/p R THR   Clinical Impression   This 50 year old female was admitted for the above sx.  She was limited by pain and will benefit from continued OT in acute setting. Goals are for min guard overall    Follow Up Recommendations  Supervision/Assistance - 24 hour    Equipment Recommendations  None recommended by OT    Recommendations for Other Services       Precautions / Restrictions Precautions Precautions: Fall Restrictions Weight Bearing Restrictions: No      Mobility Bed Mobility         Supine to sit: Min assist     General bed mobility comments: cues for sequence and assist for RLE  Transfers   Equipment used: Rolling walker (2 wheeled)   Sit to Stand: Min assist         General transfer comment: cues for UE/LE placement    Balance                                            ADL Overall ADL's : Needs assistance/impaired                                       General ADL Comments: limited evaluation:  performed orthostatics and pt wanted to return to bed.  Pain increased with standing.  Educated on AE but pt did not use this session.  Based on clinical judgment, pt needs set up for UB and mod to max A for LB adls.  She is interested in seeing this next time  Min A to stand and sidestep up [place]      Pertinent Vitals/Pain Pain Assessment: Faces Faces Pain Scale: Hurts whole lot Pain Location: R hip Pain Descriptors / Indicators: Aching Pain Intervention(s): Limited activity within patient's tolerance;Monitored during session;Premedicated before session;Repositioned;Ice applied     Hand Dominance     Extremity/Trunk Assessment Upper Extremity Assessment Upper Extremity Assessment: Defer to OT evaluation           Communication Communication Communication: No difficulties   Cognition Arousal/Alertness: Awake/alert Behavior During Therapy: WFL for tasks assessed/performed Overall Cognitive Status: Within Functional Limits for tasks assessed                     General Comments       Exercises       Shoulder Instructions      Home Living Family/patient expects to be discharged to:: Private residence Living Arrangements: Spouse/significant other Available Help at Discharge: Family               Bathroom Shower/Tub: Tub/shower unit Shower/tub characteristics: Architectural technologist: Standard     Home Equipment: Bedside commode;Tub bench          Prior Functioning/Environment Level of Independence: Independent                 OT Problem List: Decreased strength;Decreased activity tolerance;Decreased knowledge of use of DME or AE;Pain   OT Treatment/Interventions: Self-care/ADL training;DME and/or AE instruction;Patient/family education  OT Goals(Current goals can be found in the care plan section) Acute Rehab OT Goals Patient Stated Goal: Regain IND OT Goal Formulation: With patient Time For Goal Achievement: 12/21/16 Potential to Achieve Goals: Good ADL Goals Pt Will Transfer to Toilet: with min guard assist;ambulating;bedside commode Pt Will Perform Toileting - Clothing Manipulation and hygiene: with min guard assist;sit to/from stand Pt Will Perform Tub/Shower Transfer: Tub transfer;with min assist;ambulating;tub bench Additional ADL Goal #1: pt will perform LB adls with min guard Sit to stand with AE, vs verbalizing use  OT Frequency: Min 2X/week   Barriers to D/C:            Co-evaluation              End of Session    Activity Tolerance: Patient limited by pain Patient left: in bed;with call bell/phone within reach;with family/visitor present   Time: 1031-1050 OT Time Calculation (min): 19 min Charges:  OT General  Charges $OT Visit: 1 Procedure OT Evaluation $OT Eval Low Complexity: 1 Procedure G-Codes:    Kanisha Duba 12/18/16, 10:57 AM  Lesle Chris, OTR/L [phone] 2016/12/18

## 2016-12-15 LAB — CBC
HEMATOCRIT: 31.6 % — AB (ref 36.0–46.0)
HEMOGLOBIN: 10.5 g/dL — AB (ref 12.0–15.0)
MCH: 29.1 pg (ref 26.0–34.0)
MCHC: 33.2 g/dL (ref 30.0–36.0)
MCV: 87.5 fL (ref 78.0–100.0)
Platelets: 225 10*3/uL (ref 150–400)
RBC: 3.61 MIL/uL — AB (ref 3.87–5.11)
RDW: 13.4 % (ref 11.5–15.5)
WBC: 19.5 10*3/uL — AB (ref 4.0–10.5)

## 2016-12-15 LAB — BASIC METABOLIC PANEL
ANION GAP: 4 — AB (ref 5–15)
BUN: 7 mg/dL (ref 6–20)
CHLORIDE: 112 mmol/L — AB (ref 101–111)
CO2: 24 mmol/L (ref 22–32)
Calcium: 8.8 mg/dL — ABNORMAL LOW (ref 8.9–10.3)
Creatinine, Ser: 0.71 mg/dL (ref 0.44–1.00)
GFR calc Af Amer: >60 mL/min (ref >60)
GFR calc non Af Amer: >60 mL/min (ref >60)
GLUCOSE: 119 mg/dL — AB (ref 65–99)
POTASSIUM: 3.7 mmol/L (ref 3.5–5.1)
Sodium: 140 mmol/L (ref 135–145)

## 2016-12-15 MED ORDER — HYDROCODONE-ACETAMINOPHEN 5-325 MG PO TABS: 1.0000 | ORAL_TABLET | ORAL | 0 refills | Status: DC | PRN

## 2016-12-15 MED ORDER — METHOCARBAMOL 500 MG PO TABS: 500.0000 mg | ORAL_TABLET | Freq: Four times a day (QID) | ORAL | 0 refills | Status: DC | PRN

## 2016-12-15 MED ORDER — SODIUM CHLORIDE 0.9 % IV BOLUS (SEPSIS)
250.0000 mL | Freq: Once | INTRAVENOUS | Status: AC
  Administered 2016-12-15: 250 mL via INTRAVENOUS

## 2016-12-15 MED ORDER — RIVAROXABAN 10 MG PO TABS: 10.0000 mg | ORAL_TABLET | Freq: Every day | ORAL | 0 refills | Status: DC

## 2016-12-15 MED ORDER — TRAMADOL HCL 50 MG PO TABS: 50.0000 mg | ORAL_TABLET | Freq: Four times a day (QID) | ORAL | 1 refills | Status: DC | PRN

## 2016-12-15 MED FILL — HYDROCODON-APAP 5-325: 5-325 | 7 days supply | Qty: 80 | Fill #0

## 2016-12-15 MED FILL — METHOCARBAMOL 500 MG TABLET: 500 | 20 days supply | Qty: 80 | Fill #0

## 2016-12-15 MED FILL — traMADol HCL 50 MG TABS: 50 | 10 days supply | Qty: 80 | Fill #0

## 2016-12-15 MED FILL — XARELTO 10 MG TABLET: 10 | 19 days supply | Qty: 19 | Fill #0

## 2016-12-15 NOTE — Discharge Summary (Signed)
Physician Discharge Summary   Patient ID: Tammie Moore MRN: [id number] DOB/AGE: 48-Mar-1967 48 y.o.  Admit date: 12/13/2016 Discharge date: 12-15-2016  Primary Diagnosis:  Osteoarthritis of the Right hip.   Admission Diagnoses:  Past Medical History:  Diagnosis Date  . Arthritis   . Esophagitis due to doxycycline   . Gastric ulcer 05/2015  . PONV (postoperative nausea and vomiting)    Discharge Diagnoses:   Principal Problem:   OA (osteoarthritis) of hip  Estimated body mass index is 24.79 kg/m as calculated from the following:   Height as of this encounter: _0  (1.651 m).   Weight as of this encounter: 67.6 kg (149 lb).  Procedure(s) (LRB): RIGHT TOTAL  HIP ARTHROPLASTY ANTERIOR APPROACH (Right)   Consults: None  HPI: Tammie Moore is a 50 y.o. female who has advanced end-  stage arthritis of their Right  hip with progressively worsening pain and  dysfunction.The patient has failed nonoperative management and presents for  total hip arthroplasty.   Laboratory Data: Admission on 12/13/2016  Component Date Value Ref Range Status  . WBC 12/14/2016 23.2* 4.0 - 10.5 K/uL Final  . RBC 12/14/2016 3.91  3.87 - 5.11 MIL/uL Final  . Hemoglobin 12/14/2016 11.4* 12.0 - 15.0 g/dL Final  . HCT 12/14/2016 33.0* 36.0 - 46.0 % Final  . MCV 12/14/2016 84.4  78.0 - 100.0 fL Final  . MCH 12/14/2016 29.2  26.0 - 34.0 pg Final  . MCHC 12/14/2016 34.5  30.0 - 36.0 g/dL Final  . RDW 12/14/2016 12.9  11.5 - 15.5 % Final  . Platelets 12/14/2016 246  150 - 400 K/uL Final  . Sodium 12/14/2016 139  135 - 145 mmol/L Final  . Potassium 12/14/2016 4.3  3.5 - 5.1 mmol/L Final  . Chloride 12/14/2016 113* 101 - 111 mmol/L Final  . CO2 12/14/2016 23  22 - 32 mmol/L Final  . Glucose, Bld 12/14/2016 118* 65 - 99 mg/dL Final  . BUN 12/14/2016 8  6 - 20 mg/dL Final  . Creatinine, Ser 12/14/2016 0.80  0.44 - 1.00 mg/dL Final  . Calcium 12/14/2016 9.2  8.9 - 10.3 mg/dL Final  . GFR calc  non Af Amer 12/14/2016 >60  >60 mL/min Final  . GFR calc Af Amer 12/14/2016 >60  >60 mL/min Final   Comment: (NOTE) The eGFR has been calculated using the CKD EPI equation. This calculation has not been validated in all clinical situations. eGFR's persistently <60 mL/min signify possible Chronic Kidney Disease.   . Anion gap 12/14/2016 3* 5 - 15 Final  . WBC 12/15/2016 19.5* 4.0 - 10.5 K/uL Final  . RBC 12/15/2016 3.61* 3.87 - 5.11 MIL/uL Final  . Hemoglobin 12/15/2016 10.5* 12.0 - 15.0 g/dL Final  . HCT 12/15/2016 31.6* 36.0 - 46.0 % Final  . MCV 12/15/2016 87.5  78.0 - 100.0 fL Final  . MCH 12/15/2016 29.1  26.0 - 34.0 pg Final  . MCHC 12/15/2016 33.2  30.0 - 36.0 g/dL Final  . RDW 12/15/2016 13.4  11.5 - 15.5 % Final  . Platelets 12/15/2016 225  150 - 400 K/uL Final  . Sodium 12/15/2016 140  135 - 145 mmol/L Final  . Potassium 12/15/2016 3.7  3.5 - 5.1 mmol/L Final  . Chloride 12/15/2016 112* 101 - 111 mmol/L Final  . CO2 12/15/2016 24  22 - 32 mmol/L Final  . Glucose, Bld 12/15/2016 119* 65 - 99 mg/dL Final  . BUN 12/15/2016 7  6 - 20 mg/dL Final  .  Creatinine, Ser 12/15/2016 0.71  0.44 - 1.00 mg/dL Final  . Calcium 12/15/2016 8.8* 8.9 - 10.3 mg/dL Final  . GFR calc non Af Amer 12/15/2016 >60  >60 mL/min Final  . GFR calc Af Amer 12/15/2016 >60  >60 mL/min Final   Comment: (NOTE) The eGFR has been calculated using the CKD EPI equation. This calculation has not been validated in all clinical situations. eGFR's persistently <60 mL/min signify possible Chronic Kidney Disease.   Georgiann Hahn gap 12/15/2016 4* 5 - 15 Final  Hospital Outpatient Visit on 12/06/2016  Component Date Value Ref Range Status  . aPTT 12/06/2016 32  24 - 36 seconds Final  . WBC 12/06/2016 9.1  4.0 - 10.5 K/uL Final  . RBC 12/06/2016 4.96  3.87 - 5.11 MIL/uL Final  . Hemoglobin 12/06/2016 14.6  12.0 - 15.0 g/dL Final  . HCT 12/06/2016 42.8  36.0 - 46.0 % Final  . MCV 12/06/2016 86.3  78.0 - 100.0 fL Final    . MCH 12/06/2016 29.4  26.0 - 34.0 pg Final  . MCHC 12/06/2016 34.1  30.0 - 36.0 g/dL Final  . RDW 12/06/2016 12.9  11.5 - 15.5 % Final  . Platelets 12/06/2016 313  150 - 400 K/uL Final  . Sodium 12/06/2016 136  135 - 145 mmol/L Final  . Potassium 12/06/2016 4.3  3.5 - 5.1 mmol/L Final  . Chloride 12/06/2016 104  101 - 111 mmol/L Final  . CO2 12/06/2016 25  22 - 32 mmol/L Final  . Glucose, Bld 12/06/2016 86  65 - 99 mg/dL Final  . BUN 12/06/2016 12  6 - 20 mg/dL Final  . Creatinine, Ser 12/06/2016 0.90  0.44 - 1.00 mg/dL Final  . Calcium 12/06/2016 10.2  8.9 - 10.3 mg/dL Final  . Total Protein 12/06/2016 7.7  6.5 - 8.1 g/dL Final  . Albumin 12/06/2016 4.4  3.5 - 5.0 g/dL Final  . AST 12/06/2016 26  15 - 41 U/L Final  . ALT 12/06/2016 23  14 - 54 U/L Final  . Alkaline Phosphatase 12/06/2016 44  38 - 126 U/L Final  . Total Bilirubin 12/06/2016 0.8  0.3 - 1.2 mg/dL Final  . GFR calc non Af Amer 12/06/2016 >60  >60 mL/min Final  . GFR calc Af Amer 12/06/2016 >60  >60 mL/min Final   Comment: (NOTE) The eGFR has been calculated using the CKD EPI equation. This calculation has not been validated in all clinical situations. eGFR's persistently <60 mL/min signify possible Chronic Kidney Disease.   . Anion gap 12/06/2016 7  5 - 15 Final  . Prothrombin Time 12/06/2016 13.1  11.4 - 15.2 seconds Final  . INR 12/06/2016 0.99   Final  . ABO/RH(D) 12/13/2016 O POS   Final  . Antibody Screen 12/13/2016 NEG   Final  . Sample Expiration 12/13/2016 12/16/2016   Final  . Extend sample reason 12/13/2016 NO TRANSFUSIONS OR PREGNANCY IN THE PAST 3 MONTHS   Final  . Color, Urine 12/06/2016 STRAW* YELLOW Final  . APPearance 12/06/2016 CLEAR  CLEAR Final  . Specific Gravity, Urine 12/06/2016 1.003* 1.005 - 1.030 Final  . pH 12/06/2016 6.0  5.0 - 8.0 Final  . Glucose, UA 12/06/2016 NEGATIVE  NEGATIVE mg/dL Final  . Hgb urine dipstick 12/06/2016 NEGATIVE  NEGATIVE Final  . Bilirubin Urine 12/06/2016  NEGATIVE  NEGATIVE Final  . Ketones, ur 12/06/2016 NEGATIVE  NEGATIVE mg/dL Final  . Protein, ur 12/06/2016 NEGATIVE  NEGATIVE mg/dL Final  . Nitrite 12/06/2016 NEGATIVE  NEGATIVE Final  . Leukocytes, UA 12/06/2016 NEGATIVE  NEGATIVE Final  . MRSA, PCR 12/06/2016 NEGATIVE  NEGATIVE Final  . Staphylococcus aureus 12/06/2016 NEGATIVE  NEGATIVE Final   Comment:        The Xpert SA Assay (FDA approved for NASAL specimens in patients over 52 years of age), is one component of a comprehensive surveillance program.  Test performance has been validated by [facility] for patients greater than or equal to 10 year old. It is not intended to diagnose infection nor to guide or monitor treatment.   . Preg, Serum 12/06/2016 NEGATIVE  NEGATIVE Final   Comment:        THE SENSITIVITY OF THIS METHODOLOGY IS >10 mIU/mL.   . ABO/RH(D) 12/06/2016 O POS   Final     X-Rays:Dg Pelvis Portable  Result Date: 12/13/2016 CLINICAL DATA:  Status post right hip arthroplasty. EXAM: PORTABLE PELVIS 1-2 VIEWS COMPARISON:  None. FINDINGS: Hardware components of a right total hip arthroplasty identified. No complicating features identified. No periprosthetic fracture or subluxation identified. Surgical drain overlies the greater trochanter. IMPRESSION: 1. Status post right hip arthroplasty.  No complications. Electronically Signed   By: Kerby Moors M.D.   On: 12/13/2016 11:18   Dg C-arm 1-60 Min-no Report  Result Date: 12/13/2016 There is no Radiologist interpretation  for this exam.   EKG:No orders found for this or any previous visit.   Hospital Course: Patient was admitted to [facility] and taken to the OR and underwent the above state procedure without complications.  Patient tolerated the procedure well and was later transferred to the recovery room and then to the orthopaedic floor for postoperative care.  They were given PO and IV analgesics for pain control following their surgery.  They  were given 24 hours of postoperative antibiotics of  Anti-infectives    Start     Dose/Rate Route Frequency Ordered Stop   12/13/16 1600  ceFAZolin (ANCEF) IVPB 2g/100 mL premix     2 g 200 mL/hr over 30 Minutes Intravenous Every 6 hours 12/13/16 1216 12/13/16 2154   12/13/16 0659  ceFAZolin (ANCEF) IVPB 2g/100 mL premix     2 g 200 mL/hr over 30 Minutes Intravenous On call to O.R. 12/13/16 1975 12/13/16 8832     and started on DVT prophylaxis in the form of Xarelto.   PT and OT were ordered for total hip protocol.  The patient was allowed to be WBAT with therapy. Discharge planning was consulted to help with postop disposition and equipment needs.  Patient had a tough night on the evening of surgery.  They started to get up OOB with therapy on day one.  Hemovac drain was pulled without difficulty.  Continued to work with therapy into day two.  Dressing was changed on day two and the incision was healing well. Patient was seen in rounds by Dr. Wynelle Link and was ready to go home.  Diet: Cardiac diet Activity:WBAT Follow-up:in 2 weeks Disposition - Home Discharged Condition: good   Discharge Instructions    Call MD / Call 911    Complete by:  As directed    If you experience chest pain or shortness of breath, CALL 911 and be transported to the hospital emergency room.  If you develope a fever above 101 F, pus (white drainage) or increased drainage or redness at the wound, or calf pain, call your surgeon's office.   Change dressing    Complete by:  As directed  You may change your dressing dressing daily with sterile 4 x 4 inch gauze dressing and paper tape.  Do not submerge the incision under water.   Constipation Prevention    Complete by:  As directed    Drink plenty of fluids.  Prune juice may be helpful.  You may use a stool softener, such as Colace (over the counter) 100 mg twice a day.  Use MiraLax (over the counter) for constipation as needed.   Diet general    Complete by:  As  directed    Discharge instructions    Complete by:  As directed    Pick up stool softner and laxative for home use following surgery while on pain medications. Do not submerge incision under water. Please use good hand washing techniques while changing dressing each day. May shower starting three days after surgery. Please use a clean towel to pat the incision dry following showers. Continue to use ice for pain and swelling after surgery. Do not use any lotions or creams on the incision until instructed by your surgeon.   Postoperative Constipation Protocol  Constipation - defined medically as fewer than three stools per week and severe constipation as less than one stool per week.  One of the most common issues patients have following surgery is constipation.  Even if you have a regular bowel pattern at home, your normal regimen is likely to be disrupted due to multiple reasons following surgery.  Combination of anesthesia, postoperative narcotics, change in appetite and fluid intake all can affect your bowels.  In order to avoid complications following surgery, here are some recommendations in order to help you during your recovery period.  Colace (docusate) - Pick up an over-the-counter form of Colace or another stool softener and take twice a day as long as you are requiring postoperative pain medications.  Take with a full glass of water daily.  If you experience loose stools or diarrhea, hold the colace until you stool forms back up.  If your symptoms do not get better within 1 week or if they get worse, check with your doctor.  Dulcolax (bisacodyl) - Pick up over-the-counter and take as directed by the product packaging as needed to assist with the movement of your bowels.  Take with a full glass of water.  Use this product as needed if not relieved by Colace only.   MiraLax (polyethylene glycol) - Pick up over-the-counter to have on hand.  MiraLax is a solution that will increase the  amount of water in your bowels to assist with bowel movements.  Take as directed and can mix with a glass of water, juice, soda, coffee, or tea.  Take if you go more than two days without a movement. Do not use MiraLax more than once per day. Call your doctor if you are still constipated or irregular after using this medication for 7 days in a row.  If you continue to have problems with postoperative constipation, please contact the office for further assistance and recommendations.  If you experience "the worst abdominal pain ever" or develop nausea or vomiting, please contact the office immediatly for further recommendations for treatment.   Take Xarelto for two and a half more weeks, then discontinue Xarelto. Once the patient has completed the blood thinner regimen, then take a Baby 81 mg Aspirin daily for three more weeks.   Do not sit on low chairs, stoools or toilet seats, as it may be difficult to get up from low surfaces  Complete by:  As directed    Driving restrictions    Complete by:  As directed    No driving until released by the physician.   Increase activity slowly as tolerated    Complete by:  As directed    Lifting restrictions    Complete by:  As directed    No lifting until released by the physician.   Patient may shower    Complete by:  As directed    You may shower without a dressing once there is no drainage.  Do not wash over the wound.  If drainage remains, do not shower until drainage stops.   TED hose    Complete by:  As directed    Use stockings (TED hose) for 3 weeks on both leg(s).  You may remove them at night for sleeping.   Weight bearing as tolerated    Complete by:  As directed    Laterality:  right   Extremity:  Lower     Allergies as of 12/15/2016      Reactions   Sulfamethoxazole Nausea And Vomiting      Medication List    STOP taking these medications   diazepam 5 MG tablet Commonly known as:  VALIUM   ibuprofen 200 MG tablet Commonly  known as:  ADVIL,MOTRIN     TAKE these medications   acetaminophen 500 MG tablet Commonly known as:  TYLENOL Take 1,000 mg by mouth every 6 (six) hours as needed (for pain.).   HYDROcodone-acetaminophen 5-325 MG tablet Commonly known as:  NORCO/VICODIN Take 1-2 tablets by mouth every 4 (four) hours as needed for moderate pain.   methocarbamol 500 MG tablet Commonly known as:  ROBAXIN Take 1 tablet (500 mg total) by mouth every 6 (six) hours as needed for muscle spasms.   pantoprazole 40 MG tablet Commonly known as:  PROTONIX Take 1 tablet (40 mg total) by mouth 2 (two) times daily before a meal. What changed:  when to take this  reasons to take this   rivaroxaban 10 MG Tabs tablet Commonly known as:  XARELTO Take 1 tablet (10 mg total) by mouth daily with breakfast. Take Xarelto for two and a half more weeks, then discontinue Xarelto. Once the patient has completed the blood thinner regimen, then take a Baby 81 mg Aspirin daily for three more weeks.   traMADol 50 MG tablet Commonly known as:  ULTRAM Take 1-2 tablets (50-100 mg total) by mouth every 6 (six) hours as needed for moderate pain.            Durable Medical Equipment        Start     Ordered   12/14/16 1037  For home use only DME Walker rolling  Once    Question:  Patient needs a walker to treat with the following condition  Answer:  OA (osteoarthritis) of hip   12/14/16 1037     Follow-up Information    Gearlean Alf, MD. Schedule an appointment as soon as possible for a visit in 2 week(s).   Specialty:  Orthopedic Surgery Why:  Call for appointment for Tuesday 12/26/2016 or Thursday 12/28/2016 with Dr. Wynelle Link. Contact information: [address] [place] [address] [phone]           Signed: Arlee Muslim, PA-C Orthopaedic Surgery 12/15/2016, 7:58 AM

## 2016-12-15 NOTE — Progress Notes (Signed)
   Subjective: 2 Days Post-Op Procedure(s) (LRB): RIGHT TOTAL  HIP ARTHROPLASTY ANTERIOR APPROACH (Right) Patient reports pain as mild.   Patient seen in rounds with Dr. Wynelle Link. Patient is well, but has had some minor complaints of pain in the hip, requiring pain medications Patient is ready to go home following therapy. Changed to Vicodin yesterday.  Unable to arrange Home Health for this patient. Will ask hospital therapist to give detailed Home Exercise Program.  Objective: Vital signs in last 24 hours: Temp:  [97.7 F (36.5 C)-99.1 F (37.3 C)] 97.7 F (36.5 C) (12/22 0540) Pulse Rate:  [59-73] 66 (12/22 0540) Resp:  [14-16] 16 (12/22 0540) BP: (100-113)/(55-68) 102/55 (12/22 0540) SpO2:  [99 %-100 %] 99 % (12/22 0540)  Intake/Output from previous day:  Intake/Output Summary (Last 24 hours) at 12/15/16 0749 Last data filed at 12/15/16 0543  Gross per 24 hour  Intake              714 ml  Output             1225 ml  Net             -511 ml    Intake/Output this shift: No intake/output data recorded.  Labs:  Recent Labs  12/14/16 0428 12/15/16 0418  HGB 11.4* 10.5*    Recent Labs  12/14/16 0428 12/15/16 0418  WBC 23.2* 19.5*  RBC 3.91 3.61*  HCT 33.0* 31.6*  PLT 246 225    Recent Labs  12/14/16 0428 12/15/16 0418  NA 139 140  K 4.3 3.7  CL 113* 112*  CO2 23 24  BUN 8 7  CREATININE 0.80 0.71  GLUCOSE 118* 119*  CALCIUM 9.2 8.8*   No results for input(s): LABPT, INR in the last 72 hours.  EXAM: General - Patient is Alert, Appropriate and Oriented Extremity - Neurovascular intact Sensation intact distally Intact pulses distally Dorsiflexion/Plantar flexion intact Incision - clean, dry, no drainage Motor Function - intact, moving foot and toes well on exam.   Assessment/Plan: 2 Days Post-Op Procedure(s) (LRB): RIGHT TOTAL  HIP ARTHROPLASTY ANTERIOR APPROACH (Right) Procedure(s) (LRB): RIGHT TOTAL  HIP ARTHROPLASTY ANTERIOR APPROACH  (Right) Past Medical History:  Diagnosis Date  . Arthritis   . Esophagitis due to doxycycline   . Gastric ulcer 05/2015  . PONV (postoperative nausea and vomiting)    Principal Problem:   OA (osteoarthritis) of hip  Estimated body mass index is 24.79 kg/m as calculated from the following:   Height as of this encounter: 5\' 5"  (1.651 m).   Weight as of this encounter: 67.6 kg (149 lb). Up with therapy Discharge home - will give exercises for HEP Diet - Regular diet Follow up - in 2 weeks Activity - WBAT Disposition - Home Condition Upon Discharge - Good D/C Meds - See DC Summary DVT Prophylaxis - Xarelto  Arlee Muslim, PA-C Orthopaedic Surgery 12/15/2016, 7:49 AM

## 2016-12-15 NOTE — Progress Notes (Signed)
OT Note:  Checked in with pt/husband. She is doing much better today. Had questions about tub transfer.  Recommended they use lower toilet and educated on options of using tub seat or 3:1 facing out and backing onto this vs getting a tub bench.  Also reviewed readiness for stepping into tub.  They had no further questions for OT.  [place], [place] [phone] 12/15/2016

## 2016-12-15 NOTE — Progress Notes (Signed)
Physical Therapy Treatment Patient Details Name: ARAIYAH MOSBRUCKER MRN: [id number] DOB: March 10, 1966 Today's Date: 12/15/2016    History of Present Illness Pt s/p R THR    PT Comments    Pt progressing well with mobility and eager for dc home.  Reviewed stairs, therex with progression and car transfers.  Follow Up Recommendations  Home health PT     Equipment Recommendations  None recommended by PT    Recommendations for Other Services OT consult     Precautions / Restrictions Precautions Precautions: Fall Restrictions Weight Bearing Restrictions: No Other Position/Activity Restrictions: WBAT    Mobility  Bed Mobility Overal bed mobility: Needs Assistance Bed Mobility: Supine to Sit     Supine to sit: Supervision     General bed mobility comments: Pt self assisting R LE with UEs  Transfers Overall transfer level: Needs assistance Equipment used: Rolling walker (2 wheeled) Transfers: Sit to/from Stand Sit to Stand: Supervision         General transfer comment: Pt self cueing for UE/LE placement  Ambulation/Gait Ambulation/Gait assistance: Min guard;Supervision Ambulation Distance (Feet): 400 Feet Assistive device: Rolling walker (2 wheeled) Gait Pattern/deviations: Step-to pattern;Step-through pattern;Decreased step length - right;Decreased step length - left;Shuffle;Trunk flexed Gait velocity: decr Gait velocity interpretation: Below normal speed for age/gender General Gait Details: min cues for posture and position from RW   Stairs Stairs: Yes   Stair Management: No rails;Step to pattern;Backwards;Forwards;With walker Number of Stairs: 3 General stair comments: single step fwd and twice bkwd with RW and cues for sequence and foot/RW placement  Wheelchair Mobility    Modified Rankin (Stroke Patients Only)       Balance                                    Cognition Arousal/Alertness: Awake/alert Behavior During Therapy: WFL  for tasks assessed/performed Overall Cognitive Status: Within Functional Limits for tasks assessed                      Exercises Total Joint Exercises Ankle Circles/Pumps: AROM;Both;15 reps;Supine Quad Sets: AROM;Both;10 reps;Supine Heel Slides: AAROM;Right;20 reps;Supine Hip ABduction/ADduction: AAROM;Right;15 reps;Supine Long Arc Quad: AROM;Right;10 reps;Seated    General Comments        Pertinent Vitals/Pain Pain Assessment: 0-10 Pain Score: 4  Pain Location: R hip Pain Descriptors / Indicators: Aching Pain Intervention(s): Limited activity within patient's tolerance;Monitored during session;Premedicated before session;Ice applied    Home Living                      Prior Function            PT Goals (current goals can now be found in the care plan section) Acute Rehab PT Goals Patient Stated Goal: Regain IND PT Goal Formulation: With patient Time For Goal Achievement: 12/16/16 Potential to Achieve Goals: Good Progress towards PT goals: Progressing toward goals    Frequency    7X/week      PT Plan Current plan remains appropriate    Co-evaluation             End of Session Equipment Utilized During Treatment: Gait belt Activity Tolerance: Patient tolerated treatment well Patient left: in chair;with call bell/phone within reach;with family/visitor present     Time: [id number] PT Time Calculation (min) (ACUTE ONLY): 46 min  Charges:  $Gait Training: 8-22 mins $Therapeutic Exercise: 8-22 mins $Therapeutic Activity: 8-22  mins                    G Codes:      D'Arcy Abraha 2016-12-29, 12:53 PM

## 2017-07-19 ENCOUNTER — Other Ambulatory Visit: Payer: Self-pay | Admitting: Unknown Physician Specialty

## 2017-07-19 DIAGNOSIS — Z1231 Encounter for screening mammogram for malignant neoplasm of breast: Secondary | ICD-10-CM

## 2017-08-03 ENCOUNTER — Ambulatory Visit
Admission: RE | Admit: 2017-08-03 | Discharge: 2017-08-03 | Disposition: A | Discharge status: Home / Self Care | Payer: BLUE CROSS/BLUE SHIELD | Source: Ambulatory Visit | Attending: Unknown Physician Specialty | Admitting: Unknown Physician Specialty

## 2017-08-03 DIAGNOSIS — Z1231 Encounter for screening mammogram for malignant neoplasm of breast: Secondary | ICD-10-CM

## 2017-08-07 ENCOUNTER — Other Ambulatory Visit: Payer: Self-pay | Admitting: Unknown Physician Specialty

## 2017-08-07 DIAGNOSIS — R928 Other abnormal and inconclusive findings on diagnostic imaging of breast: Secondary | ICD-10-CM

## 2017-08-09 ENCOUNTER — Ambulatory Visit
Admission: RE | Admit: 2017-08-09 | Discharge: 2017-08-09 | Disposition: A | Discharge status: Home / Self Care | Payer: BLUE CROSS/BLUE SHIELD | Source: Ambulatory Visit | Attending: Unknown Physician Specialty | Admitting: Unknown Physician Specialty

## 2017-08-09 ENCOUNTER — Other Ambulatory Visit: Payer: Self-pay | Admitting: Unknown Physician Specialty

## 2017-08-09 DIAGNOSIS — R928 Other abnormal and inconclusive findings on diagnostic imaging of breast: Secondary | ICD-10-CM

## 2017-08-09 DIAGNOSIS — N6320 Unspecified lump in the left breast, unspecified quadrant: Secondary | ICD-10-CM

## 2017-08-13 ENCOUNTER — Other Ambulatory Visit: Payer: Self-pay | Admitting: Unknown Physician Specialty

## 2017-08-13 ENCOUNTER — Ambulatory Visit
Admission: RE | Admit: 2017-08-13 | Discharge: 2017-08-13 | Disposition: A | Discharge status: Home / Self Care | Payer: BLUE CROSS/BLUE SHIELD | Source: Ambulatory Visit | Attending: Unknown Physician Specialty | Admitting: Unknown Physician Specialty

## 2017-08-13 DIAGNOSIS — N6320 Unspecified lump in the left breast, unspecified quadrant: Secondary | ICD-10-CM

## 2018-05-22 ENCOUNTER — Other Ambulatory Visit: Payer: Self-pay | Admitting: Internal Medicine

## 2018-05-22 DIAGNOSIS — K219 Gastro-esophageal reflux disease without esophagitis: Secondary | ICD-10-CM

## 2018-05-22 MED ORDER — PANTOPRAZOLE SODIUM 40 MG PO TBEC: 40.0000 mg | DELAYED_RELEASE_TABLET | Freq: Every day | ORAL | 3 refills | Status: DC

## 2018-05-22 MED FILL — PANTOPRAZOLE SOD DR 40 MG T: 40 | 30 days supply | Qty: 30 | Fill #0

## 2018-05-22 NOTE — Progress Notes (Signed)
Upper chest, throat, burning discomfort  Tickling in throat Nocturnal cough, nonproductive No URI symptoms Lately with drinking tea, indigestion and heartburn Some weight gain lately with decreased activity with hip sugery No exertional symptoms  Pantoprazole 40 mg once daily before breakfast x 1 month Notify me in not better in 2 weeks

## 2018-06-18 MED FILL — PANTOPRAZOLE SOD DR 40 MG T: 40 | 30 days supply | Qty: 30 | Fill #1

## 2018-08-08 ENCOUNTER — Other Ambulatory Visit: Payer: Self-pay | Admitting: Unknown Physician Specialty

## 2018-08-08 DIAGNOSIS — Z1231 Encounter for screening mammogram for malignant neoplasm of breast: Secondary | ICD-10-CM

## 2018-09-06 ENCOUNTER — Ambulatory Visit: Payer: Self-pay

## 2018-09-10 ENCOUNTER — Ambulatory Visit
Admission: RE | Admit: 2018-09-10 | Discharge: 2018-09-10 | Disposition: A | Discharge status: Home / Self Care | Payer: BLUE CROSS/BLUE SHIELD | Source: Ambulatory Visit | Attending: Unknown Physician Specialty | Admitting: Unknown Physician Specialty

## 2018-09-10 DIAGNOSIS — Z1231 Encounter for screening mammogram for malignant neoplasm of breast: Secondary | ICD-10-CM

## 2019-09-10 ENCOUNTER — Other Ambulatory Visit: Payer: Self-pay | Admitting: Unknown Physician Specialty

## 2019-09-10 DIAGNOSIS — Z1231 Encounter for screening mammogram for malignant neoplasm of breast: Secondary | ICD-10-CM

## 2019-10-15 ENCOUNTER — Encounter: Payer: BLUE CROSS/BLUE SHIELD | Admitting: Internal Medicine

## 2019-10-24 ENCOUNTER — Ambulatory Visit
Admission: RE | Admit: 2019-10-24 | Discharge: 2019-10-24 | Disposition: A | Discharge status: Home / Self Care | Payer: BLUE CROSS/BLUE SHIELD | Source: Ambulatory Visit | Attending: Unknown Physician Specialty | Admitting: Unknown Physician Specialty

## 2019-10-24 ENCOUNTER — Other Ambulatory Visit: Payer: Self-pay

## 2019-10-24 DIAGNOSIS — Z1231 Encounter for screening mammogram for malignant neoplasm of breast: Secondary | ICD-10-CM

## 2020-11-03 ENCOUNTER — Other Ambulatory Visit: Payer: Self-pay | Admitting: Unknown Physician Specialty

## 2020-11-03 DIAGNOSIS — Z1231 Encounter for screening mammogram for malignant neoplasm of breast: Secondary | ICD-10-CM

## 2020-11-12 ENCOUNTER — Ambulatory Visit
Admission: RE | Admit: 2020-11-12 | Discharge: 2020-11-12 | Disposition: A | Discharge status: Home / Self Care | Payer: BC Managed Care – PPO | Source: Ambulatory Visit

## 2020-11-12 ENCOUNTER — Other Ambulatory Visit: Payer: Self-pay

## 2020-11-12 DIAGNOSIS — Z1231 Encounter for screening mammogram for malignant neoplasm of breast: Secondary | ICD-10-CM

## 2020-11-17 ENCOUNTER — Other Ambulatory Visit: Payer: Self-pay | Admitting: Unknown Physician Specialty

## 2020-11-17 DIAGNOSIS — R928 Other abnormal and inconclusive findings on diagnostic imaging of breast: Secondary | ICD-10-CM

## 2020-11-22 ENCOUNTER — Other Ambulatory Visit: Payer: Self-pay

## 2020-11-22 ENCOUNTER — Ambulatory Visit (AMBULATORY_SURGERY_CENTER): Payer: BC Managed Care – PPO | Admitting: *Deleted

## 2020-11-22 VITALS — Ht 65.0 in | Wt 157.0 lb

## 2020-11-22 DIAGNOSIS — Z1211 Encounter for screening for malignant neoplasm of colon: Secondary | ICD-10-CM

## 2020-11-22 MED ORDER — PLENVU 140 G PO SOLR: 1.0000 | Freq: Once | ORAL | 0 refills | Status: AC

## 2020-11-22 NOTE — Progress Notes (Signed)
Patient's pre-visit was done today over the phone with the patient due to COVID-19 pandemic. Name,DOB and address verified. Insurance verified. Packet of Prep instructions mailed to patient including copy of a consent form and pre-procedure patient acknowledgement form-pt is aware.  Patient understands to call us back with any questions or concerns. COVID-19 vaccines completed on 03/19/2020, per patient.   Plenvu prep sample given to the patient.

## 2020-11-29 ENCOUNTER — Other Ambulatory Visit: Payer: Self-pay

## 2020-11-29 ENCOUNTER — Ambulatory Visit (AMBULATORY_SURGERY_CENTER): Payer: BC Managed Care – PPO | Admitting: Internal Medicine

## 2020-11-29 ENCOUNTER — Encounter: Payer: Self-pay | Admitting: Internal Medicine

## 2020-11-29 VITALS — BP 123/73 | HR 59 | Temp 97.3°F | Resp 25 | Ht 65.0 in | Wt 157.0 lb

## 2020-11-29 DIAGNOSIS — Z1211 Encounter for screening for malignant neoplasm of colon: Secondary | ICD-10-CM | POA: Diagnosis not present

## 2020-11-29 DIAGNOSIS — D128 Benign neoplasm of rectum: Secondary | ICD-10-CM | POA: Diagnosis not present

## 2020-11-29 DIAGNOSIS — D123 Benign neoplasm of transverse colon: Secondary | ICD-10-CM | POA: Diagnosis not present

## 2020-11-29 MED ORDER — SODIUM CHLORIDE 0.9 % IV SOLN: 500.0000 mL | Freq: Once | INTRAVENOUS | Status: DC

## 2020-11-29 NOTE — Progress Notes (Signed)
Pt's states no medical or surgical changes since previsit or office visit. 

## 2020-11-29 NOTE — Op Note (Signed)
[facility] Patient Name: Tammie Moore Procedure Date: 11/29/2020 1:03 PM MRN: [id number] Endoscopist: Jerene Bears , MD Age: 54 Referring MD:  Date of Birth: 1966-10-07 Gender: Female Account #: [id number] Procedure:                Colonoscopy Indications:              Screening for colorectal malignant neoplasm, This                            is the patient's first colonoscopy Medicines:                Monitored Anesthesia Care Procedure:                Pre-Anesthesia Assessment:                           - Prior to the procedure, a History and Physical                            was performed, and patient medications and                            allergies were reviewed. The patient's tolerance of                            previous anesthesia was also reviewed. The risks                            and benefits of the procedure and the sedation                            options and risks were discussed with the patient.                            All questions were answered, and informed consent                            was obtained. Prior Anticoagulants: The patient has                            taken no previous anticoagulant or antiplatelet                            agents. ASA Grade Assessment: II - A patient with                            mild systemic disease. After reviewing the risks                            and benefits, the patient was deemed in                            satisfactory condition to undergo the procedure.  After obtaining informed consent, the colonoscope                            was passed under direct vision. Throughout the                            procedure, the patient's blood pressure, pulse, and                            oxygen saturations were monitored continuously. The                            Colonoscope was introduced through the anus and                            advanced to the terminal  ileum. The colonoscopy was                            performed without difficulty. The patient tolerated                            the procedure well. The quality of the bowel                            preparation was excellent. The terminal ileum,                            ileocecal valve, appendiceal orifice, and rectum                            were photographed. The bowel preparation used was                            Plenvu via split dose instruction. Scope In: 1:36:39 PM Scope Out: 1:50:47 PM Scope Withdrawal Time: 0 hours 11 minutes 22 seconds  Total Procedure Duration: 0 hours 14 minutes 8 seconds  Findings:                 The digital rectal exam was normal.                           The terminal ileum appeared normal.                           A 3 mm polyp was found in the transverse colon. The                            polyp was sessile. The polyp was removed with a                            cold snare. Resection and retrieval were complete.                           A 2 mm polyp was found in the  distal rectum. The                            polyp was sessile. The polyp was removed with a                            cold snare. Resection and retrieval were complete.                           The exam was otherwise without abnormality on                            direct and retroflexion views. Complications:            No immediate complications. Estimated Blood Loss:     Estimated blood loss was minimal. Impression:               - The examined portion of the ileum was normal.                           - One 3 mm polyp in the transverse colon, removed                            with a cold snare. Resected and retrieved.                           - One 2 mm polyp in the distal rectum, removed with                            a cold snare. Resected and retrieved.                           - The examination was otherwise normal on direct                            and  retroflexion views. Recommendation:           - Patient has a contact number available for                            emergencies. The signs and symptoms of potential                            delayed complications were discussed with the                            patient. Return to normal activities tomorrow.                            Written discharge instructions were provided to the                            patient.                           -  Resume previous diet.                           - Continue present medications.                           - Await pathology results.                           - Repeat colonoscopy is recommended. The                            colonoscopy date will be determined after pathology                            results from today's exam become available for                            review. Jerene Bears, MD 11/29/2020 2:03:26 PM This report has been signed electronically.

## 2020-11-29 NOTE — Progress Notes (Signed)
VS taken by Donaldson 

## 2020-11-29 NOTE — Progress Notes (Signed)
To PACU, VSS. Report to [email or web address] 

## 2020-11-29 NOTE — Progress Notes (Signed)
Called to room to assist during endoscopic procedure.  Patient ID and intended procedure confirmed with present staff. Received instructions for my participation in the procedure from the performing physician.  

## 2020-11-29 NOTE — Patient Instructions (Signed)
Discharge instructions given. Handout on polyps. Resume previous medications. YOU HAD AN ENDOSCOPIC PROCEDURE TODAY AT THE [facility]:   Refer to the procedure report that was given to you for any specific questions about what was found during the examination.  If the procedure report does not answer your questions, please call your gastroenterologist to clarify.  If you requested that your care partner not be given the details of your procedure findings, then the procedure report has been included in a sealed envelope for you to review at your convenience later.  YOU SHOULD EXPECT: Some feelings of bloating in the abdomen. Passage of more gas than usual.  Walking can help get rid of the air that was put into your GI tract during the procedure and reduce the bloating. If you had a lower endoscopy (such as a colonoscopy or flexible sigmoidoscopy) you may notice spotting of blood in your stool or on the toilet paper. If you underwent a bowel prep for your procedure, you may not have a normal bowel movement for a few days.  Please Note:  You might notice some irritation and congestion in your nose or some drainage.  This is from the oxygen used during your procedure.  There is no need for concern and it should clear up in a day or so.  SYMPTOMS TO REPORT IMMEDIATELY:  Following lower endoscopy (colonoscopy or flexible sigmoidoscopy):  Excessive amounts of blood in the stool  Significant tenderness or worsening of abdominal pains  Swelling of the abdomen that is new, acute  Fever of 100F or higher   For urgent or emergent issues, a gastroenterologist can be reached at any hour by calling [phone]. Do not use MyChart messaging for urgent concerns.    DIET:  We do recommend a small meal at first, but then you may proceed to your regular diet.  Drink plenty of fluids but you should avoid alcoholic beverages for 24 hours.  ACTIVITY:  You should plan to take it easy for the rest  of today and you should NOT DRIVE or use heavy machinery until tomorrow (because of the sedation medicines used during the test).    FOLLOW UP: Our staff will call the number listed on your records 48-72 hours following your procedure to check on you and address any questions or concerns that you may have regarding the information given to you following your procedure. If we do not reach you, we will leave a message.  We will attempt to reach you two times.  During this call, we will ask if you have developed any symptoms of COVID 19. If you develop any symptoms (ie: fever, flu-like symptoms, shortness of breath, cough etc.) before then, please call [phone].  If you test positive for Covid 19 in the 2 weeks post procedure, please call and report this information to us.    If any biopsies were taken you will be contacted by phone or by letter within the next 1-3 weeks.  Please call us at [phone] if you have not heard about the biopsies in 3 weeks.    SIGNATURES/CONFIDENTIALITY: You and/or your care partner have signed paperwork which will be entered into your electronic medical record.  These signatures attest to the fact that that the information above on your After Visit Summary has been reviewed and is understood.  Full responsibility of the confidentiality of this discharge information lies with you and/or your care-partner.  

## 2020-12-01 ENCOUNTER — Telehealth: Payer: Self-pay

## 2020-12-01 NOTE — Telephone Encounter (Signed)
  Follow up Call-  Call back number 11/29/2020  Post procedure Call Back phone  # [phone]  Permission to leave phone message Yes  Some recent data might be hidden     Patient questions:  Do you have a fever, pain , or abdominal swelling? No. Pain Score  0 *  Have you tolerated food without any problems? Yes.    Have you been able to return to your normal activities? Yes.    Do you have any questions about your discharge instructions: Diet   No. Medications  No. Follow up visit  No.  Do you have questions or concerns about your Care? No.  Actions: * If pain score is 4 or above: 1. No action needed, pain <4.Have you developed a fever since your procedure? no  2.   Have you had an respiratory symptoms (SOB or cough) since your procedure? no  3.   Have you tested positive for COVID 19 since your procedure no  4.   Have you had any family members/close contacts diagnosed with the COVID 19 since your procedure?  no   If yes to any of these questions please route to Joylene John, RN and Joella Prince, RN

## 2020-12-03 ENCOUNTER — Ambulatory Visit
Admission: RE | Admit: 2020-12-03 | Discharge: 2020-12-03 | Disposition: A | Discharge status: Home / Self Care | Payer: BC Managed Care – PPO | Source: Ambulatory Visit | Attending: Unknown Physician Specialty | Admitting: Unknown Physician Specialty

## 2020-12-03 ENCOUNTER — Other Ambulatory Visit: Payer: Self-pay

## 2020-12-03 DIAGNOSIS — R928 Other abnormal and inconclusive findings on diagnostic imaging of breast: Secondary | ICD-10-CM

## 2020-12-07 ENCOUNTER — Encounter: Payer: Self-pay | Admitting: Internal Medicine

## 2021-03-15 ENCOUNTER — Other Ambulatory Visit: Payer: Self-pay | Admitting: Obstetrics and Gynecology

## 2021-03-15 DIAGNOSIS — Z803 Family history of malignant neoplasm of breast: Secondary | ICD-10-CM

## 2021-04-08 ENCOUNTER — Other Ambulatory Visit: Payer: Self-pay

## 2021-04-08 ENCOUNTER — Ambulatory Visit
Admission: RE | Admit: 2021-04-08 | Discharge: 2021-04-08 | Disposition: A | Discharge status: Home / Self Care | Payer: BC Managed Care – PPO | Source: Ambulatory Visit | Attending: Obstetrics and Gynecology | Admitting: Obstetrics and Gynecology

## 2021-04-08 DIAGNOSIS — Z803 Family history of malignant neoplasm of breast: Secondary | ICD-10-CM

## 2021-04-08 MED ORDER — GADOBUTROL 1 MMOL/ML IV SOLN
7.0000 mL | Freq: Once | INTRAVENOUS | Status: AC | PRN
  Administered 2021-04-08: 7 mL via INTRAVENOUS

## 2021-04-14 ENCOUNTER — Other Ambulatory Visit: Payer: Self-pay | Admitting: Obstetrics and Gynecology

## 2021-04-14 DIAGNOSIS — R9389 Abnormal findings on diagnostic imaging of other specified body structures: Secondary | ICD-10-CM

## 2021-04-15 ENCOUNTER — Ambulatory Visit
Admission: RE | Admit: 2021-04-15 | Discharge: 2021-04-15 | Disposition: A | Discharge status: Home / Self Care | Payer: BC Managed Care – PPO | Source: Ambulatory Visit | Attending: Obstetrics and Gynecology | Admitting: Obstetrics and Gynecology

## 2021-04-15 ENCOUNTER — Other Ambulatory Visit: Payer: Self-pay | Admitting: Radiology

## 2021-04-15 ENCOUNTER — Other Ambulatory Visit: Payer: Self-pay

## 2021-04-15 DIAGNOSIS — R9389 Abnormal findings on diagnostic imaging of other specified body structures: Secondary | ICD-10-CM

## 2021-04-15 MED ORDER — GADOBUTROL 1 MMOL/ML IV SOLN
7.0000 mL | Freq: Once | INTRAVENOUS | Status: AC | PRN
  Administered 2021-04-15: 7 mL via INTRAVENOUS

## 2021-09-06 ENCOUNTER — Other Ambulatory Visit: Payer: Self-pay | Admitting: Obstetrics and Gynecology

## 2021-09-06 DIAGNOSIS — Z808 Family history of malignant neoplasm of other organs or systems: Secondary | ICD-10-CM

## 2021-10-05 ENCOUNTER — Other Ambulatory Visit: Payer: BC Managed Care – PPO

## 2021-12-14 ENCOUNTER — Other Ambulatory Visit: Payer: BC Managed Care – PPO

## 2022-01-01 ENCOUNTER — Other Ambulatory Visit: Payer: BC Managed Care – PPO

## 2022-03-05 IMAGING — MG DIGITAL SCREENING BILAT W/ TOMO W/ CAD
8 series · 8 of 24 positions shown · non-contrast
Comparison: Previous exam(s).

CLINICAL DATA: Screening.

EXAM:
DIGITAL SCREENING BILATERAL MAMMOGRAM WITH TOMO AND CAD

[L MLO synth-2D]
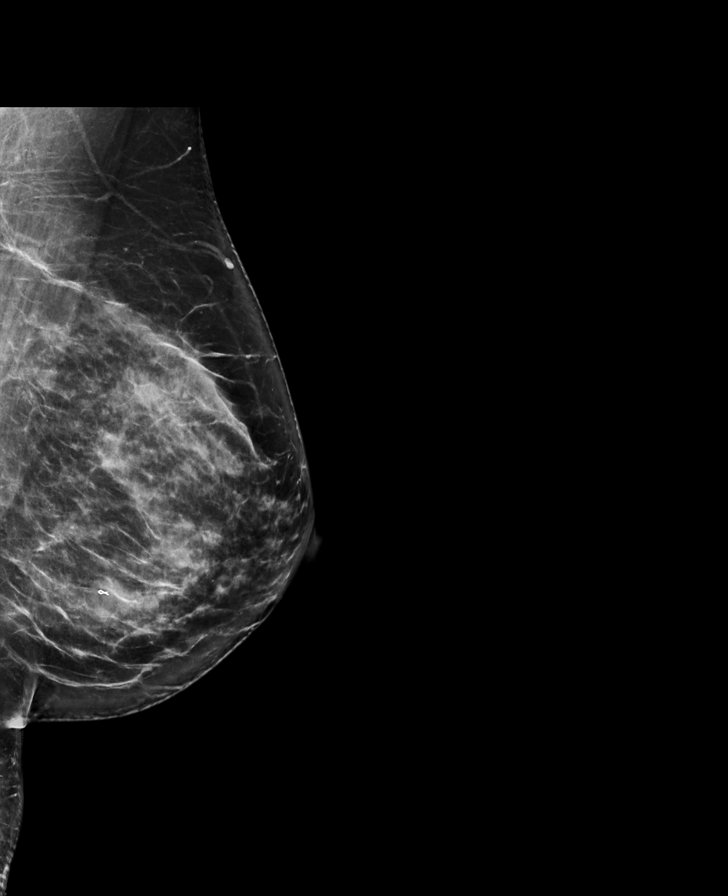

[L CC synth-2D]
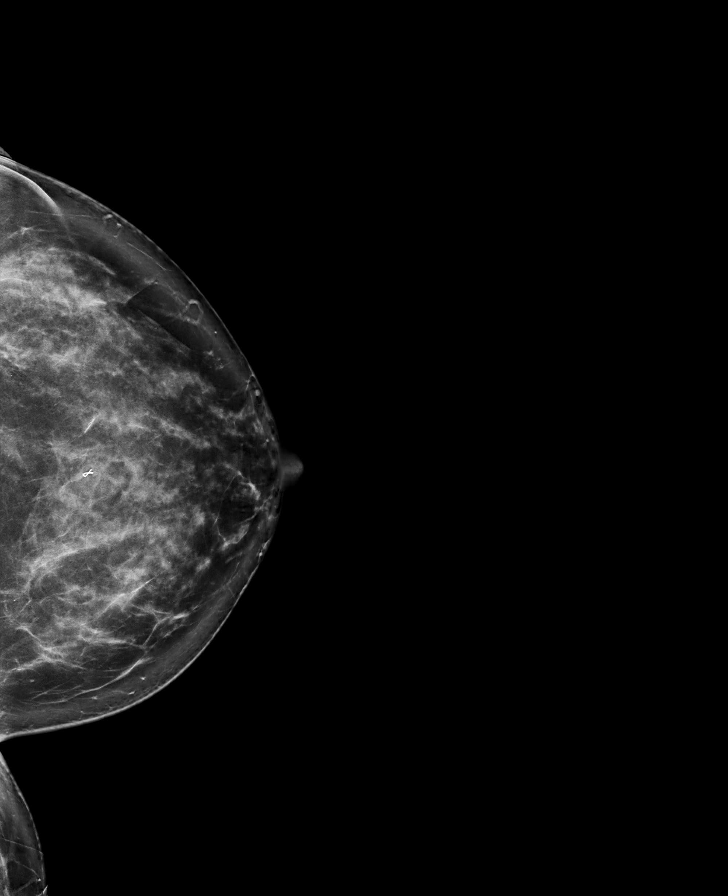

[R MLO synth-2D]
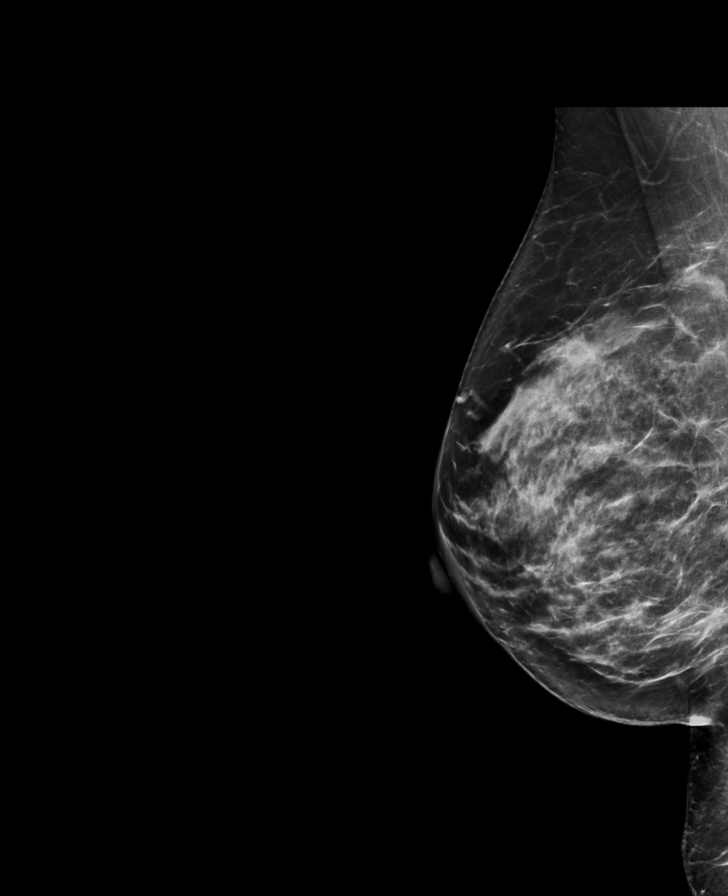

[R CC synth-2D]
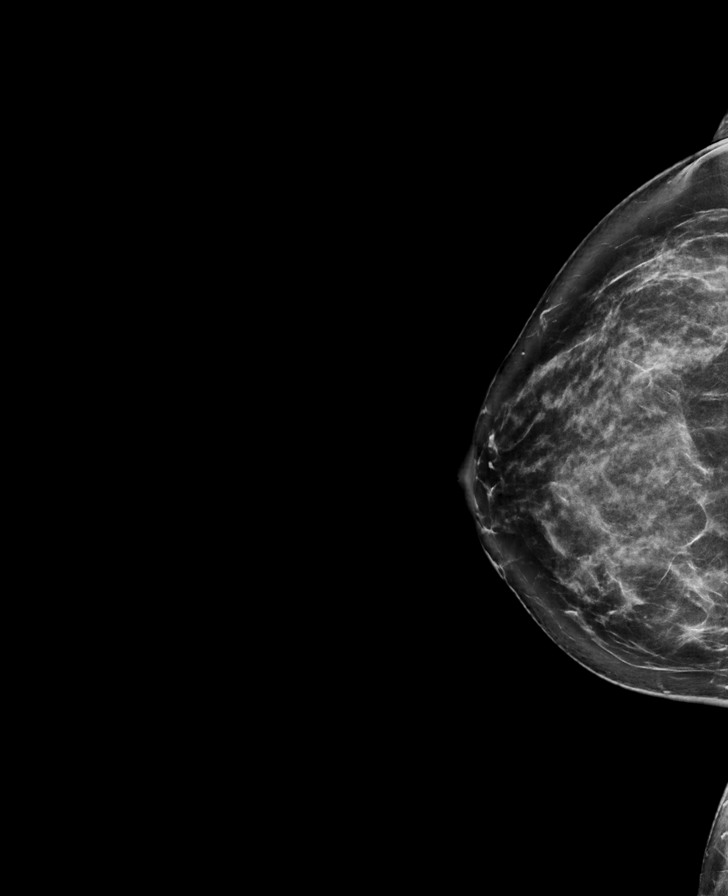

[L MLO tomo · tomo slice 47/92.0]
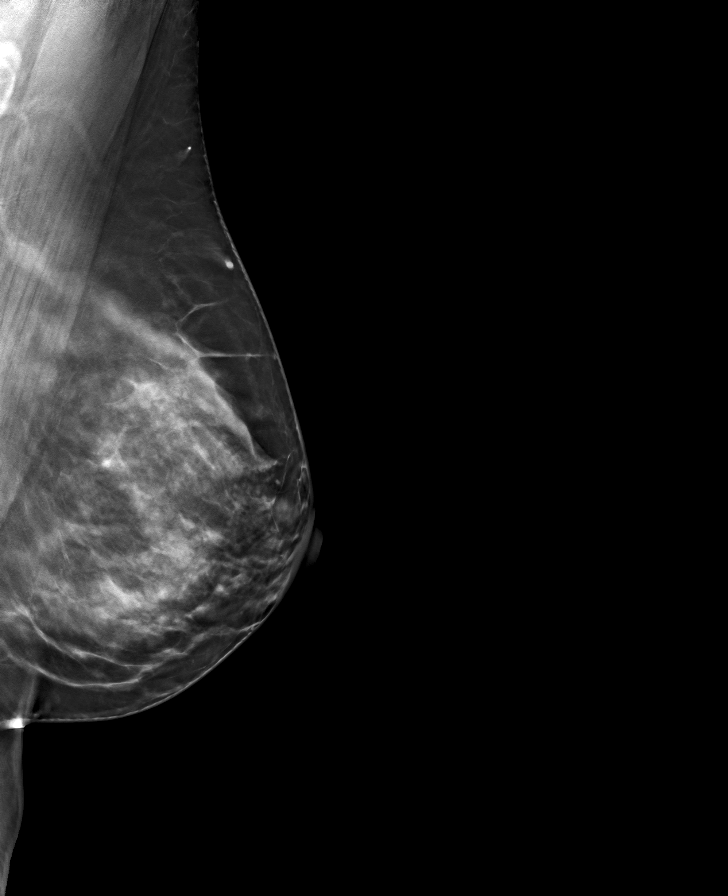

[R MLO tomo · tomo slice 43/85.0]
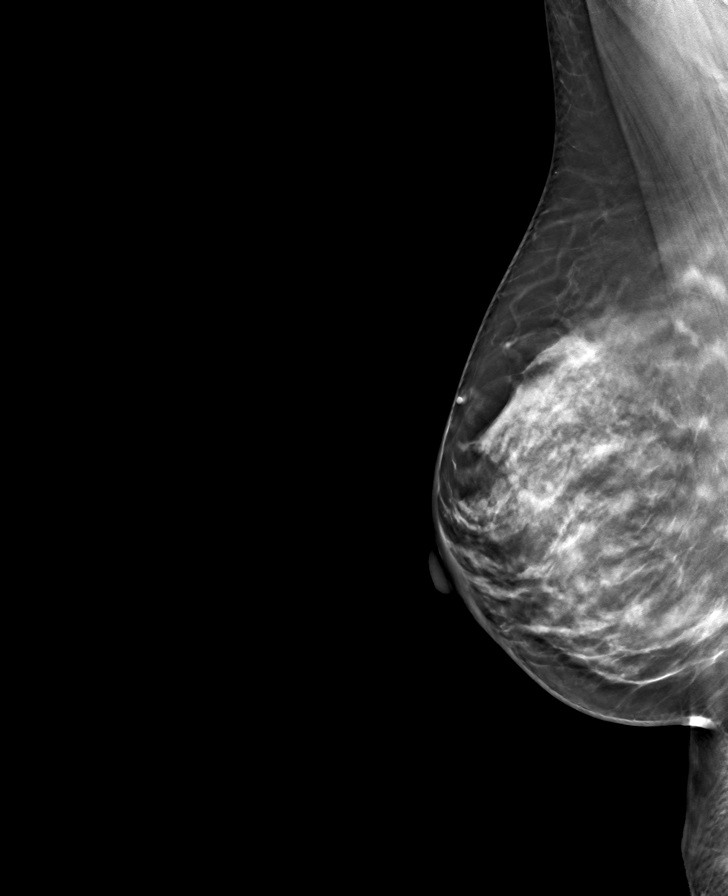

[R CC tomo · tomo slice 43/85.0]
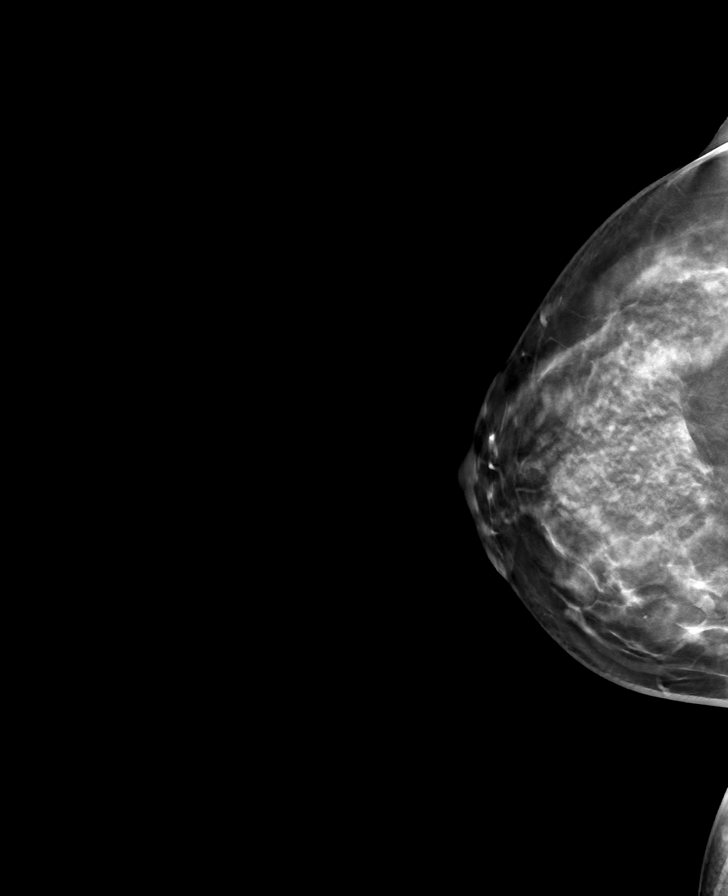

[L CC tomo · tomo slice 47/92.0]
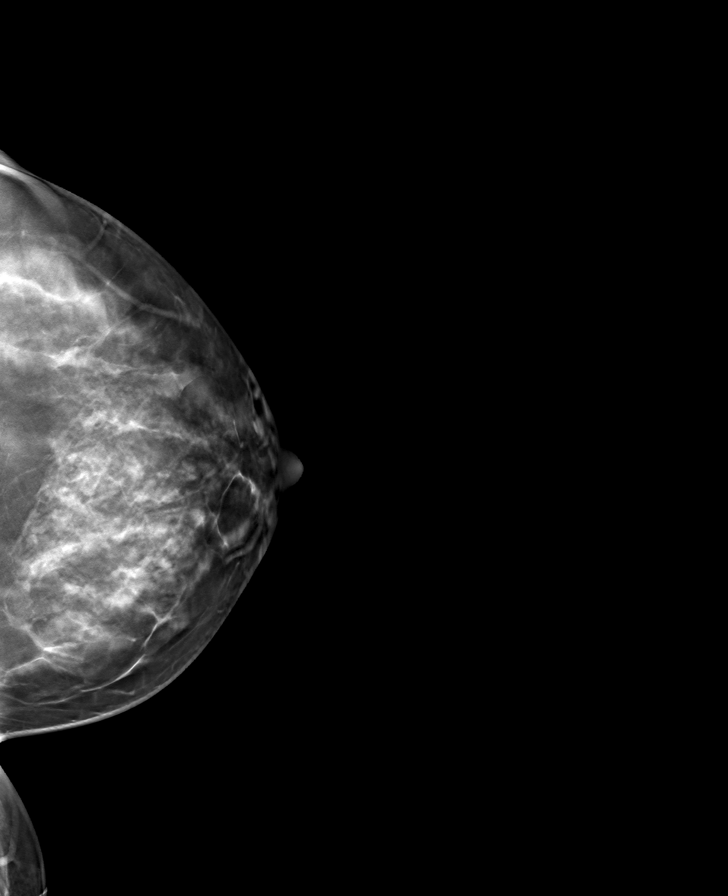

[8 of 24 positions shown; findings below may reference images not displayed]

ACR Breast Density Category c: The breast tissue is heterogeneously
dense, which may obscure small masses.
FINDINGS: In the right breast, a possible asymmetry warrants further
evaluation. In the left breast, no findings suspicious for
malignancy. Images were processed with CAD.
IMPRESSION: Further evaluation is suggested for possible asymmetry in the right
breast.

RECOMMENDATION:
Diagnostic mammogram and possibly ultrasound of the right breast.
(Code:EU-[id number])

The patient will be contacted regarding the findings, and additional
imaging will be scheduled.

BI-RADS CATEGORY  0: Incomplete. Need additional imaging evaluation
and/or prior mammograms for comparison.

## 2022-03-14 ENCOUNTER — Other Ambulatory Visit: Payer: Self-pay | Admitting: Obstetrics and Gynecology

## 2022-03-14 DIAGNOSIS — N63 Unspecified lump in unspecified breast: Secondary | ICD-10-CM

## 2022-03-16 ENCOUNTER — Other Ambulatory Visit: Payer: Self-pay | Admitting: Obstetrics and Gynecology

## 2022-03-16 DIAGNOSIS — Z808 Family history of malignant neoplasm of other organs or systems: Secondary | ICD-10-CM

## 2022-03-27 ENCOUNTER — Ambulatory Visit
Admission: RE | Admit: 2022-03-27 | Discharge: 2022-03-27 | Disposition: A | Discharge status: Home / Self Care | Payer: BC Managed Care – PPO | Source: Ambulatory Visit | Attending: Obstetrics and Gynecology | Admitting: Obstetrics and Gynecology

## 2022-03-27 DIAGNOSIS — N63 Unspecified lump in unspecified breast: Secondary | ICD-10-CM

## 2022-03-29 ENCOUNTER — Other Ambulatory Visit: Payer: BC Managed Care – PPO

## 2022-04-10 ENCOUNTER — Ambulatory Visit
Admission: RE | Admit: 2022-04-10 | Discharge: 2022-04-10 | Disposition: A | Discharge status: Home / Self Care | Payer: BC Managed Care – PPO | Source: Ambulatory Visit | Attending: Obstetrics and Gynecology | Admitting: Obstetrics and Gynecology

## 2022-04-10 DIAGNOSIS — Z808 Family history of malignant neoplasm of other organs or systems: Secondary | ICD-10-CM

## 2022-04-10 MED ORDER — GADOBUTROL 1 MMOL/ML IV SOLN
5.0000 mL | Freq: Once | INTRAVENOUS | Status: AC | PRN
  Administered 2022-04-10: 5 mL via INTRAVENOUS

## 2023-04-04 ENCOUNTER — Other Ambulatory Visit: Payer: Self-pay | Admitting: Obstetrics and Gynecology

## 2023-04-04 DIAGNOSIS — Z1231 Encounter for screening mammogram for malignant neoplasm of breast: Secondary | ICD-10-CM

## 2023-06-04 ENCOUNTER — Ambulatory Visit
Admission: RE | Admit: 2023-06-04 | Discharge: 2023-06-04 | Disposition: A | Discharge status: Home / Self Care | Payer: BC Managed Care – PPO | Source: Ambulatory Visit | Attending: Obstetrics and Gynecology | Admitting: Obstetrics and Gynecology

## 2023-06-04 DIAGNOSIS — Z1231 Encounter for screening mammogram for malignant neoplasm of breast: Secondary | ICD-10-CM

## 2024-04-15 ENCOUNTER — Other Ambulatory Visit: Payer: Self-pay | Admitting: Obstetrics and Gynecology

## 2024-04-15 DIAGNOSIS — Z803 Family history of malignant neoplasm of breast: Secondary | ICD-10-CM

## 2024-04-24 ENCOUNTER — Encounter: Payer: Self-pay | Admitting: Obstetrics and Gynecology

## 2024-05-23 ENCOUNTER — Ambulatory Visit
Admission: RE | Admit: 2024-05-23 | Discharge: 2024-05-23 | Disposition: A | Discharge status: Home / Self Care | Source: Ambulatory Visit | Attending: Obstetrics and Gynecology | Admitting: Obstetrics and Gynecology

## 2024-05-23 DIAGNOSIS — Z803 Family history of malignant neoplasm of breast: Secondary | ICD-10-CM

## 2024-05-23 MED ORDER — GADOPICLENOL 0.5 MMOL/ML IV SOLN
7.0000 mL | Freq: Once | INTRAVENOUS | Status: AC | PRN
  Administered 2024-05-23: 7 mL via INTRAVENOUS

## 2024-05-23 MED ORDER — GADOPICLENOL 0.5 MMOL/ML IV SOLN
10.0000 mL | Freq: Once | INTRAVENOUS | Status: AC | PRN
  Administered 2024-05-23: 10 mL via INTRAVENOUS

## 2024-11-24 ENCOUNTER — Other Ambulatory Visit: Payer: Self-pay | Admitting: Obstetrics and Gynecology

## 2024-11-24 DIAGNOSIS — Z1231 Encounter for screening mammogram for malignant neoplasm of breast: Secondary | ICD-10-CM

## 2024-12-16 ENCOUNTER — Encounter

## 2024-12-16 DIAGNOSIS — Z1231 Encounter for screening mammogram for malignant neoplasm of breast: Secondary | ICD-10-CM

## 2024-12-23 ENCOUNTER — Ambulatory Visit
Admission: RE | Admit: 2024-12-23 | Discharge: 2024-12-23 | Disposition: A | Discharge status: Home / Self Care | Source: Ambulatory Visit

## 2024-12-23 DIAGNOSIS — Z1231 Encounter for screening mammogram for malignant neoplasm of breast: Secondary | ICD-10-CM

## 2024-12-26 ENCOUNTER — Other Ambulatory Visit: Payer: Self-pay | Admitting: Medical Genetics

## 2024-12-29 ENCOUNTER — Other Ambulatory Visit: Payer: Self-pay | Admitting: Obstetrics and Gynecology

## 2024-12-29 DIAGNOSIS — R928 Other abnormal and inconclusive findings on diagnostic imaging of breast: Secondary | ICD-10-CM

## 2024-12-31 ENCOUNTER — Ambulatory Visit
Admission: RE | Admit: 2024-12-31 | Discharge: 2024-12-31 | Disposition: A | Discharge status: Home / Self Care | Source: Ambulatory Visit | Attending: Obstetrics and Gynecology | Admitting: Obstetrics and Gynecology

## 2024-12-31 DIAGNOSIS — R928 Other abnormal and inconclusive findings on diagnostic imaging of breast: Secondary | ICD-10-CM

## 2025-01-06 ENCOUNTER — Encounter

## 2025-01-06 ENCOUNTER — Other Ambulatory Visit
